# Patient Record
Sex: Female | Born: 1985 | Race: Black or African American | Hispanic: No | Marital: Single | Smoking: Never smoker
Health system: Southern US, Community
[De-identification: ages and names within clinical notes are randomized; demographics above are authoritative.]

## PROBLEM LIST (undated history)

## (undated) ENCOUNTER — Ambulatory Visit: Admission: EM | Payer: Medicaid Other | Source: Home / Self Care

## (undated) DIAGNOSIS — Z789 Other specified health status: Secondary | ICD-10-CM

## (undated) DIAGNOSIS — R569 Unspecified convulsions: Secondary | ICD-10-CM

## (undated) HISTORY — DX: Unspecified convulsions: R56.9

## (undated) HISTORY — PX: MENISCUS REPAIR: SHX5179

## (undated) HISTORY — PX: ACHILLES TENDON REPAIR: SUR1153

---

## 2004-11-08 HISTORY — PX: MENISCUS REPAIR: SHX5179

## 2013-10-28 ENCOUNTER — Emergency Department (HOSPITAL_COMMUNITY)
Admission: EM | Admit: 2013-10-28 | Discharge: 2013-10-28 | Disposition: A | Discharge status: Home / Self Care | Payer: Medicaid Other | Attending: Emergency Medicine | Admitting: Emergency Medicine

## 2013-10-28 ENCOUNTER — Encounter (HOSPITAL_COMMUNITY): Payer: Self-pay | Admitting: Emergency Medicine

## 2013-10-28 DIAGNOSIS — K0889 Other specified disorders of teeth and supporting structures: Secondary | ICD-10-CM

## 2013-10-28 DIAGNOSIS — K089 Disorder of teeth and supporting structures, unspecified: Secondary | ICD-10-CM | POA: Insufficient documentation

## 2013-10-28 DIAGNOSIS — H9209 Otalgia, unspecified ear: Secondary | ICD-10-CM | POA: Insufficient documentation

## 2013-10-28 DIAGNOSIS — O9989 Other specified diseases and conditions complicating pregnancy, childbirth and the puerperium: Secondary | ICD-10-CM | POA: Insufficient documentation

## 2013-10-28 MED ORDER — PENICILLIN V POTASSIUM 500 MG PO TABS: 500.0000 mg | ORAL_TABLET | Freq: Four times a day (QID) | ORAL | Status: AC

## 2013-10-28 MED ORDER — BUPIVACAINE-EPINEPHRINE PF 0.5-1:200000 % IJ SOLN: 1.8000 mL | Freq: Once | INTRAMUSCULAR | Status: DC

## 2013-10-28 NOTE — ED Provider Notes (Signed)
CSN: [id number]     Arrival date & time 10/28/13  1824 History  This chart was scribed for non-physician practitioner Santiago Glad, PA-C,  working with Doug Sou, MD, by Yevette Edwards, ED Scribe. This patient was seen in room WTR9/WTR9 and the patient's care was started at 7:26 PM.   First MD Initiated Contact with Patient 10/28/13 1855     Chief Complaint  Patient presents with  . Dental Pain   The history is provided by the patient. No language interpreter was used.   HPI Comments: Tracie Valenzuela is a 27 y.o. female, who is five months pregnant, who presents to the Emergency Department complaining of constant pain to a right, lower tooth which began yesterday, but worsened today. She characterizes the pain as "excruciating" and she rates the pain as 9/10. She also reports increased pain upon swallowing. The pt has also experienced pain and bleeding to her gums, headache and otalgia. She has treated the pain with Orajel and Ibuprofen without relief. She denies fever, chills, nausea, and emesis. Denies difficulty swallowing.  Denies facial swelling.  She is a non-smoker.  The pt does not have a dentist.   History reviewed. No pertinent past medical history. History reviewed. No pertinent past surgical history. History reviewed. No pertinent family history. History  Substance Use Topics  . Smoking status: Never Smoker   . Smokeless tobacco: Not on file  . Alcohol Use: Not on file   OB History   Grav Para Term Preterm Abortions TAB SAB Ect Mult Living   1              Review of Systems  Constitutional: Negative for fever and chills.  HENT: Positive for dental problem and ear pain.   Gastrointestinal: Negative for nausea and vomiting.  Neurological: Positive for headaches.  All other systems reviewed and are negative.   Allergies  Review of patient's allergies indicates no known allergies.  Home Medications  No current outpatient prescriptions on file.  Triage  Vitals: BP 128/78  Pulse 75  Temp(Src) 98.6 F (37 C) (Oral)  Resp 16  SpO2 100%  Physical Exam  Nursing note and vitals reviewed. Constitutional: She is oriented to person, place, and time. She appears well-developed and well-nourished. No distress.  HENT:  Head: Normocephalic and atraumatic.  Right Ear: Tympanic membrane and ear canal normal.  Left Ear: Tympanic membrane and ear canal normal.  Mouth/Throat: Uvula is midline, oropharynx is clear and moist and mucous membranes are normal. No trismus in the jaw. Abnormal dentition. No dental abscesses or uvula swelling. No oropharyngeal exudate, posterior oropharyngeal edema, posterior oropharyngeal erythema or tonsillar abscesses.    Pt able to open and close mouth with out difficulty. Airway intact. Uvula midline. Mild gingival swelling with tenderness over affected area, but no fluctuance. No swelling or tenderness of submental and submandibular regions.  Eyes: Conjunctivae and EOM are normal.  Neck: Normal range of motion and full passive range of motion without pain. Neck supple. No tracheal deviation present.  Cardiovascular: Normal rate, regular rhythm and normal heart sounds.   Pulmonary/Chest: Effort normal and breath sounds normal. No stridor. No respiratory distress. She has no wheezes.  Musculoskeletal: Normal range of motion.  Lymphadenopathy:       Head (right side): No submental, no submandibular, no tonsillar, no preauricular and no posterior auricular adenopathy present.       Head (left side): No submental, no submandibular, no tonsillar, no preauricular and no posterior auricular adenopathy present.  She has no cervical adenopathy.  Neurological: She is alert and oriented to person, place, and time.  Skin: Skin is warm and dry. No rash noted. She is not diaphoretic.  Psychiatric: She has a normal mood and affect. Her behavior is normal.    ED Course  Dental Date/Time: 10/29/2013 9:55 AM Performed by: Santiago Glad Authorized by: Santiago Glad Consent: Verbal consent obtained. Risks and benefits: risks, benefits and alternatives were discussed Consent given by: patient Patient understanding: patient states understanding of the procedure being performed Patient consent: the patient's understanding of the procedure matches consent given Patient identity confirmed: verbally with patient Time out: Immediately prior to procedure a "time out" was called to verify the correct patient, procedure, equipment, support staff and site/side marked as required. Local anesthesia used: yes Local anesthetic: lidocaine 2% with epinephrine Anesthetic total: 2 ml Patient sedated: no Patient tolerance: Patient tolerated the procedure well with no immediate complications.   (including critical care time)  DIAGNOSTIC STUDIES: Oxygen Saturation is 100% on room air, normal by my interpretation.    COORDINATION OF CARE:  7:32 PM- Discussed treatment plan with patient, which includes a referral to a dentist and ob-gyn as well as numbing the tooth, and the patient agreed to the plan.   8:00 PM- Rechecked pt. Pt complained of an additional site causing dental pain which she also wanted numbed.   Labs Review Labs Reviewed - No data to display Imaging Review No results found.  EKG Interpretation   None       MDM  No diagnosis found. Patient with toothache.  No gross abscess.  Exam unconcerning for Ludwig's angina or spread of infection.  Will treat with penicillin.  Patient educated on not using Ibuprofen due to the fact that she is pregnant.  Patient instructed to take Tylenol for the pain.  Urged patient to follow-up with dentist.    I personally performed the services described in this documentation, which was scribed in my presence. The recorded information has been reviewed and is accurate.    Santiago Glad, PA-C 10/29/13 [phone]

## 2013-10-28 NOTE — ED Notes (Signed)
She c/o right lower toothache and also c/o gum pain and bleeding.  She is in no distress.  She also states she had right achilles tendon repair last year and "want it checked".

## 2013-10-31 NOTE — ED Provider Notes (Signed)
Medical screening examination/treatment/procedure(s) were performed by non-physician practitioner and as supervising physician I was immediately available for consultation/collaboration.  EKG Interpretation   None        Doug Sou, MD 10/31/13 [phone]

## 2013-11-08 NOTE — L&D Delivery Note (Signed)
`````  Attestation of Attending Supervision of Advanced Practitioner: Evaluation and management procedures were performed by the PA/NP/CNM/OB Fellow under my supervision/collaboration. Chart reviewed and agree with management and plan.  Tilda BurrowJohn V Recardo Linn 02/15/2014 7:44 AM

## 2013-11-08 NOTE — L&D Delivery Note (Signed)
Delivery Note At 6:45 AM a viable female was delivered via Vaginal, Spontaneous Delivery (Presentation: OA>LOA ;  ).  APGAR:9 ,9 ; weight pending .   Placenta status: Intact, Spontaneous.  Cord: 3 vessels with the following complications: None.  Anesthesia: Epidural  Episiotomy: None Lacerations: none Suture Repair: n/a Est. Blood Loss (mL): 250  Mom to postpartum.  Baby to Nursery. [facility]  Aidah Forquer 02/11/2014, 6:58 AM

## 2013-11-09 ENCOUNTER — Encounter (HOSPITAL_COMMUNITY): Payer: Self-pay | Admitting: Emergency Medicine

## 2013-11-09 ENCOUNTER — Emergency Department (HOSPITAL_COMMUNITY)
Admission: EM | Admit: 2013-11-09 | Discharge: 2013-11-09 | Disposition: A | Discharge status: Home / Self Care | Payer: Medicaid Other | Attending: Emergency Medicine | Admitting: Emergency Medicine

## 2013-11-09 DIAGNOSIS — O98819 Other maternal infectious and parasitic diseases complicating pregnancy, unspecified trimester: Secondary | ICD-10-CM | POA: Insufficient documentation

## 2013-11-09 DIAGNOSIS — B3731 Acute candidiasis of vulva and vagina: Secondary | ICD-10-CM | POA: Insufficient documentation

## 2013-11-09 DIAGNOSIS — B373 Candidiasis of vulva and vagina: Secondary | ICD-10-CM

## 2013-11-09 DIAGNOSIS — M549 Dorsalgia, unspecified: Secondary | ICD-10-CM | POA: Insufficient documentation

## 2013-11-09 DIAGNOSIS — O9989 Other specified diseases and conditions complicating pregnancy, childbirth and the puerperium: Secondary | ICD-10-CM | POA: Insufficient documentation

## 2013-11-09 HISTORY — DX: Other specified health status: Z78.9

## 2013-11-09 LAB — URINE MICROSCOPIC-ADD ON

## 2013-11-09 LAB — URINALYSIS, ROUTINE W REFLEX MICROSCOPIC
BILIRUBIN URINE: NEGATIVE
GLUCOSE, UA: NEGATIVE mg/dL
Hgb urine dipstick: NEGATIVE
KETONES UR: NEGATIVE mg/dL
Nitrite: NEGATIVE
PH: 7.5 (ref 5.0–8.0)
Protein, ur: NEGATIVE mg/dL
Specific Gravity, Urine: 1.025 (ref 1.005–1.030)
Urobilinogen, UA: 0.2 mg/dL (ref 0.0–1.0)

## 2013-11-09 LAB — WET PREP, GENITAL
CLUE CELLS WET PREP: NONE SEEN
TRICH WET PREP: NONE SEEN

## 2013-11-09 MED ORDER — FLUCONAZOLE 150 MG PO TABS: 150.0000 mg | ORAL_TABLET | Freq: Once | ORAL | Status: DC

## 2013-11-09 MED ORDER — FLUCONAZOLE 100 MG PO TABS
150.0000 mg | ORAL_TABLET | Freq: Once | ORAL | Status: AC
  Administered 2013-11-09: 150 mg via ORAL
  Filled 2013-11-09: qty 2

## 2013-11-09 NOTE — Progress Notes (Addendum)
1035  Arrived to evaluate this 28 yo G2P1 @25 .[redacted] wks GA with complaint of "contractions".  Limited prenatal care.  [place]. [place]is [place]ren[place] @ 6wks, but did continue to attend regular care because she states she was considering a TAB.  Moved to this area recently.  Admits marijuana use today.  States it is the only thing helping her pain.  Was recently seen for tooth pain and prescribed PCN.  She stopped taking the PCN 2 days ago because she felt it was causing her vaginal itching, swelling and discharge.  1100  SSE by ED MD, swabs taken.  Cervix closed on inspection.  ED MD states he has spoken with FP and will refer patient for appointment with the clinic per their recommendation.  Awaiting urine specimen from patient.  1137 Dr. Jolayne Pantheronstant notified of FHR, UC readings and of ED plan of care.  Patient is cleared to come off the monitor/ OB cleared.

## 2013-11-09 NOTE — ED Notes (Signed)
Pt reports 6 months pregnant, began having pains x few weeks. States today began feeling like she was having contractions. No bleeding, denies water breaking. 1 previous pregnancy.

## 2013-11-09 NOTE — ED Notes (Signed)
Pt states has just moved here in past 2 months, no prenatal care here, has EDD  02/17/14--states has felt more movement from fetus in past couple days--small amount of vaginal discharge -- taking PCN for dental infection. No other vaginal discharge. States is having "contractions" started 2 days ago-- not regular at present.

## 2013-11-09 NOTE — Discharge Instructions (Signed)
If you are still having vaginal discharge in 3 days, take the extra dose of Diflucan. It is very important to follow up at [facility] in their clinic for Pre-natal care.  Candida Infection, Adult A candida infection (also called yeast, fungus and Monilia infection) is an overgrowth of yeast that can occur anywhere on the body. A yeast infection commonly occurs in warm, moist body areas. Usually, the infection remains localized but can spread to become a systemic infection. A yeast infection may be a sign of a more severe disease such as diabetes, leukemia, or AIDS. A yeast infection can occur in both men and women. In women, Candida vaginitis is a vaginal infection. It is one of the most common causes of vaginitis. Men usually do not have symptoms or know they have an infection until other problems develop. Men may find out they have a yeast infection because their sex partner has a yeast infection. Uncircumcised men are more likely to get a yeast infection than circumcised men. This is because the uncircumcised glans is not exposed to air and does not remain as dry as that of a circumcised glans. Older adults may develop yeast infections around dentures. CAUSES  Women  Antibiotics.  Steroid medication taken for a long time.  Being overweight (obese).  Diabetes.  Poor immune condition.  Certain serious medical conditions.  Immune suppressive medications for organ transplant patients.  Chemotherapy.  Pregnancy.  Menstration.  Stress and fatigue.  Intravenous drug use.  Oral contraceptives.  Wearing tight-fitting clothes in the crotch area.  Catching it from a sex partner who has a yeast infection.  Spermicide.  Intravenous, urinary, or other catheters. Men  Catching it from a sex partner who has a yeast infection.  Having oral or anal sex with a person who has the infection.  Spermicide.  Diabetes.  Antibiotics.  Poor immune system.  Medications that  suppress the immune system.  Intravenous drug use.  Intravenous, urinary, or other catheters. SYMPTOMS  Women  Thick, white vaginal discharge.  Vaginal itching.  Redness and swelling in and around the vagina.  Irritation of the lips of the vagina and perineum.  Blisters on the vaginal lips and perineum.  Painful sexual intercourse.  Low blood sugar (hypoglycemia).  Painful urination.  Bladder infections.  Intestinal problems such as constipation, indigestion, bad breath, bloating, increase in gas, diarrhea, or loose stools. Men  Men may develop intestinal problems such as constipation, indigestion, bad breath, bloating, increase in gas, diarrhea, or loose stools.  Dry, cracked skin on the penis with itching or discomfort.  Jock itch.  Dry, flaky skin.  Athlete's foot.  Hypoglycemia. DIAGNOSIS  Women  A history and an exam are performed.  The discharge may be examined under a microscope.  A culture may be taken of the discharge. Men  A history and an exam are performed.  Any discharge from the penis or areas of cracked skin will be looked at under the microscope and cultured.  Stool samples may be cultured. TREATMENT  Women  Vaginal antifungal suppositories and creams.  Medicated creams to decrease irritation and itching on the outside of the vagina.  Warm compresses to the perineal area to decrease swelling and discomfort.  Oral antifungal medications.  Medicated vaginal suppositories or cream for repeated or recurrent infections.  Wash and dry the irritation areas before applying the cream.  Eating yogurt with lactobacillus may help with prevention and treatment.  Sometimes painting the vagina with gentian violet solution may help  if creams and suppositories do not work. Men  Antifungal creams and oral antifungal medications.  Sometimes treatment must continue for 30 days after the symptoms go away to prevent recurrence. HOME CARE  INSTRUCTIONS  Women  Use cotton underwear and avoid tight-fitting clothing.  Avoid colored, scented toilet paper and deodorant tampons or pads.  Do not douche.  Keep your diabetes under control.  Finish all the prescribed medications.  Keep your skin clean and dry.  Consume milk or yogurt with lactobacillus active culture regularly. If you get frequent yeast infections and think that is what the infection is, there are over-the-counter medications that you can get. If the infection does not show healing in 3 days, talk to your caregiver.  Tell your sex partner you have a yeast infection. Your partner may need treatment also, especially if your infection does not clear up or recurs. Men  Keep your skin clean and dry.  Keep your diabetes under control.  Finish all prescribed medications.  Tell your sex partner that you have a yeast infection so they can be treated if necessary. SEEK MEDICAL CARE IF:   Your symptoms do not clear up or worsen in one week after treatment.  You have an oral temperature above 102 F (38.9 C).  You have trouble swallowing or eating for a prolonged time.  You develop blisters on and around your vagina.  You develop vaginal bleeding and it is not your menstrual period.  You develop abdominal pain.  You develop intestinal problems as mentioned above.  You get weak or lightheaded.  You have painful or increased urination.  You have pain during sexual intercourse. MAKE SURE YOU:   Understand these instructions.  Will watch your condition.  Will get help right away if you are not doing well or get worse. Document Released: 12/02/2004 Document Revised: 01/17/2012 Document Reviewed: 03/16/2010 [facility] Patient Information 2014 [employer] [place].

## 2013-11-09 NOTE — ED Provider Notes (Signed)
CSN: [id number]     Arrival date & time 11/09/13  1008 History   First MD Initiated Contact with Patient 11/09/13 1016     Chief Complaint  Patient presents with  . Contractions   (Consider location/radiation/quality/duration/timing/severity/associated sxs/prior Treatment) HPI Comments: 28 year old female who is currently approximately 25 weeks and 5 days pregnant presents with 3 days of lower abdominal cramping that feels like contractions. She states his pains worse when she had her first child. The pain seems to be constant. She's not have any dysuria or change in urination. She has been on penicillin recently for a dental infection and over the past week has been having vaginal discharge. She denied any vaginal bleeding or large leakage of fluid. She states the baby feels like it's moving around more. She states the pain is currently about a 5/10. She recently moved to this area and does not have an obstetrician currently.   History reviewed. No pertinent past medical history. History reviewed. No pertinent past surgical history. No family history on file. History  Substance Use Topics  . Smoking status: Never Smoker   . Smokeless tobacco: Not on file  . Alcohol Use: Not on file   OB History   Grav Para Term Preterm Abortions TAB SAB Ect Mult Living   1              Review of Systems  Constitutional: Negative for fever and chills.  Gastrointestinal: Positive for abdominal pain. Negative for vomiting.  Genitourinary: Positive for vaginal discharge. Negative for dysuria and vaginal bleeding.  Musculoskeletal: Positive for back pain.  All other systems reviewed and are negative.    Allergies  Review of patient's allergies indicates no known allergies.  Home Medications   Current Outpatient Rx  Name  Route  Sig  Dispense  Refill  . ibuprofen (ADVIL,MOTRIN) 200 MG tablet   Oral   Take 600 mg by mouth every 6 (six) hours as needed (pain).          BP 115/71  Pulse 80   Temp(Src) 98.4 F (36.9 C)  Resp 16  SpO2 100% Physical Exam  Nursing note and vitals reviewed. Constitutional: She is oriented to person, place, and time. She appears well-developed and well-nourished. No distress.  HENT:  Head: Normocephalic and atraumatic.  Right Ear: External ear normal.  Left Ear: External ear normal.  Nose: Nose normal.  Eyes: Right eye exhibits no discharge. Left eye exhibits no discharge.  Cardiovascular: Normal rate, regular rhythm and normal heart sounds.   Pulmonary/Chest: Effort normal and breath sounds normal.  Abdominal: Soft. There is tenderness in the suprapubic area.  Gravid  Genitourinary: Vaginal discharge found.  Cervix is closed visually during speculum exam  Neurological: She is alert and oriented to person, place, and time.  Skin: Skin is warm and dry.    ED Course  Procedures (including critical care time) Labs Review Labs Reviewed  WET PREP, GENITAL - Abnormal; Notable for the following:    Yeast Wet Prep HPF POC MANY (*)    WBC, Wet Prep HPF POC TOO NUMEROUS TO COUNT (*)    All other components within normal limits  URINALYSIS, ROUTINE W REFLEX MICROSCOPIC - Abnormal; Notable for the following:    Leukocytes, UA SMALL (*)    All other components within normal limits  URINE MICROSCOPIC-ADD ON - Abnormal; Notable for the following:    Squamous Epithelial / LPF FEW (*)    All other components within normal limits  GC/CHLAMYDIA  PROBE AMP   Imaging Review No results found.  EKG Interpretation   None       MDM   1. Vulvovaginal candidiasis    Patient's abd exam is benign. No contractions seen on continuous toco, and patient is active and has good HR pattern. No LOF, bleeding or decreased fetal movement. Her discharge is c/w yeast from recent abx. Visually her cervix is closed, and with no contractions I feel she can f/u as an outpatient. D/w Dr. Jolayne Panther at [facility], who recommends ASAP outpatient f/u to establish OB care in this  area. Recommends one dose of diflucan now, and will give an Rx in case she is still symptomatic in 3 days.     Audree Camel, MD 11/09/13 [phone]

## 2013-11-11 LAB — GC/CHLAMYDIA PROBE AMP
CT Probe RNA: NEGATIVE
GC Probe RNA: NEGATIVE

## 2013-11-14 ENCOUNTER — Encounter: Payer: Self-pay | Admitting: Obstetrics and Gynecology

## 2013-11-14 ENCOUNTER — Ambulatory Visit (INDEPENDENT_AMBULATORY_CARE_PROVIDER_SITE_OTHER): Payer: Medicaid Other | Admitting: Obstetrics and Gynecology

## 2013-11-14 ENCOUNTER — Other Ambulatory Visit (HOSPITAL_COMMUNITY)
Admission: RE | Admit: 2013-11-14 | Discharge: 2013-11-14 | Disposition: A | Discharge status: Home / Self Care | Payer: Medicaid Other | Source: Ambulatory Visit | Attending: Obstetrics and Gynecology | Admitting: Obstetrics and Gynecology

## 2013-11-14 VITALS — BP 119/73 | Temp 98.6°F | Wt 202.3 lb

## 2013-11-14 DIAGNOSIS — Z124 Encounter for screening for malignant neoplasm of cervix: Secondary | ICD-10-CM

## 2013-11-14 DIAGNOSIS — Z01419 Encounter for gynecological examination (general) (routine) without abnormal findings: Secondary | ICD-10-CM | POA: Insufficient documentation

## 2013-11-14 DIAGNOSIS — O0932 Supervision of pregnancy with insufficient antenatal care, second trimester: Secondary | ICD-10-CM

## 2013-11-14 DIAGNOSIS — Z23 Encounter for immunization: Secondary | ICD-10-CM

## 2013-11-14 DIAGNOSIS — O0930 Supervision of pregnancy with insufficient antenatal care, unspecified trimester: Secondary | ICD-10-CM

## 2013-11-14 LAB — POCT URINALYSIS DIP (DEVICE)
Bilirubin Urine: NEGATIVE
Glucose, UA: NEGATIVE mg/dL
HGB URINE DIPSTICK: NEGATIVE
Ketones, ur: NEGATIVE mg/dL
Leukocytes, UA: NEGATIVE
Nitrite: NEGATIVE
PH: 7 (ref 5.0–8.0)
PROTEIN: 30 mg/dL — AB
SPECIFIC GRAVITY, URINE: 1.025 (ref 1.005–1.030)
Urobilinogen, UA: 1 mg/dL (ref 0.0–1.0)

## 2013-11-14 LAB — HIV ANTIBODY (ROUTINE TESTING W REFLEX): HIV: NONREACTIVE

## 2013-11-14 LAB — POCT PREGNANCY, URINE: PREG TEST UR: POSITIVE — AB

## 2013-11-14 LAB — GLUCOSE TOLERANCE, 1 HOUR (50G) W/O FASTING: Glucose, 1 Hour GTT: 80 mg/dL (ref 70–140)

## 2013-11-14 MED ORDER — PRENATAL PLUS 27-1 MG PO TABS: 1.0000 | ORAL_TABLET | Freq: Every day | ORAL | Status: DC

## 2013-11-14 MED ORDER — TETANUS-DIPHTH-ACELL PERTUSSIS 5-2.5-18.5 LF-MCG/0.5 IM SUSP: 0.5000 mL | Freq: Once | INTRAMUSCULAR | Status: DC

## 2013-11-14 NOTE — Progress Notes (Signed)
   Subjective:    Tracie Valenzuela is a 28 yo G2P1001 2770w3d being seen today for her first obstetrical visit.  Her obstetrical history is significant for NSVD. Patient does intend to breast feed. Pregnancy history fully reviewed.  Patient reports no complaints. [place] [employer] [place] [employer] [place] [employer] [place]  Filed Vitals:   11/14/13 0847  BP: 119/73  Temp: 98.6 F (37 C)  Weight: 202 lb 4.8 oz (91.763 kg)    HISTORY: OB History  Gravida Para Term Preterm AB SAB TAB Ectopic Multiple Living  2 1 1  0 0 0 0 0 0 1    # Outcome Date GA Lbr Len/2nd Weight Sex Delivery Anes PTL Lv  2 CUR           1 TRM 02/24/06 7103w0d  7 lb 6 oz (3.345 kg) F SVD EPI N Y     Past Medical History  Diagnosis Date  . Medical history non-contributory    Past Surgical History  Procedure Laterality Date  . Meniscus repair    . Achilles tendon repair     Family History  Problem Relation Age of Onset  . Hypertension Paternal Grandmother   . Diabetes Paternal Grandmother      Exam    Uterus:  Fundal Height: 22 cm  Pelvic Exam:    Perineum: Normal Perineum   Vulva: normal, Bartholin's, Urethra, Skene's normal   Vagina:  normal mucosa, normal discharge       Cervix: multiparous appearance   Adnexa: not evaluated   Bony Pelvis: gynecoid  System: Breast:  normal appearance, no masses or tenderness   Skin: normal coloration and turgor, no rashes    Neurologic: oriented, normal, grossly non-focal   Extremities: normal strength, tone, and muscle mass   HEENT PERRLA and extra ocular movement intact   Mouth/Teeth mucous membranes moist, pharynx normal without lesions and dental hygiene good   Neck supple and no masses   Cardiovascular: regular rate and rhythm, no murmurs or gallops   Respiratory:  appears well, vitals normal, no respiratory distress, acyanotic, normal RR, ear and throat exam is normal, neck free of mass or lymphadenopathy, chest clear, no wheezing, crepitations, rhonchi,  normal symmetric air entry   Abdomen: S=D   Urinary: urethral meatus normal      Assessment:    Pregnancy: G2P1001 Patient Active Problem List   Diagnosis Date Noted  . Insufficient prenatal care in second trimester 11/14/2013        Plan:     Initial labs drawn. Glucola.  Prenatal vitamins. Problem list reviewed and updated. Genetic Screening discussed Quad Screen: too late.  Ultrasound discussed; fetal survey: ordered.  Follow up in 4 weeks. 50% of 30min visit spent on counseling and coordination of care.  Reviewed pregnnany precautions, visit schedule  Paeton Latouche 11/14/2013

## 2013-11-14 NOTE — Progress Notes (Signed)
Pulse- 87 Patient reports pelvic pressure and "irritability", like the other night when she went to the ER

## 2013-11-14 NOTE — Patient Instructions (Signed)
Second Trimester of Pregnancy The second trimester is from week 13 through week 28, months 4 through 6. The second trimester is often a time when you feel your best. Your body has also adjusted to being pregnant, and you begin to feel better physically. Usually, morning sickness has lessened or quit completely, you may have more energy, and you may have an increase in appetite. The second trimester is also a time when the fetus is growing rapidly. At the end of the sixth month, the fetus is about 9 inches long and weighs about 1 pounds. You will likely begin to feel the baby move (quickening) between 18 and 20 weeks of the pregnancy. BODY CHANGES Your body goes through many changes during pregnancy. The changes vary from woman to woman.   Your weight will continue to increase. You will notice your lower abdomen bulging out.  You may begin to get stretch marks on your hips, abdomen, and breasts.  You may develop headaches that can be relieved by medicines approved by your caregiver.  You may urinate more often because the fetus is pressing on your bladder.  You may develop or continue to have heartburn as a result of your pregnancy.  You may develop constipation because certain hormones are causing the muscles that push waste through your intestines to slow down.  You may develop hemorrhoids or swollen, bulging veins (varicose veins).  You may have back pain because of the weight gain and pregnancy hormones relaxing your joints between the bones in your pelvis and as a result of a shift in weight and the muscles that support your balance.  Your breasts will continue to grow and be tender.  Your gums may bleed and may be sensitive to brushing and flossing.  Dark spots or blotches (chloasma, mask of pregnancy) may develop on your face. This will likely fade after the baby is born.  A dark line from your belly button to the pubic area (linea nigra) may appear. This will likely fade after the  baby is born. WHAT TO EXPECT AT YOUR PRENATAL VISITS During a routine prenatal visit:  You will be weighed to make sure you and the fetus are growing normally.  Your blood pressure will be taken.  Your abdomen will be measured to track your baby's growth.  The fetal heartbeat will be listened to.  Any test results from the previous visit will be discussed. Your caregiver may ask you:  How you are feeling.  If you are feeling the baby move.  If you have had any abnormal symptoms, such as leaking fluid, bleeding, severe headaches, or abdominal cramping.  If you have any questions. Other tests that may be performed during your second trimester include:  Blood tests that check for:  Low iron levels (anemia).  Gestational diabetes (between 24 and 28 weeks).  Rh antibodies.  Urine tests to check for infections, diabetes, or protein in the urine.  An ultrasound to confirm the proper growth and development of the baby.  An amniocentesis to check for possible genetic problems.  Fetal screens for spina bifida and Down syndrome. HOME CARE INSTRUCTIONS   Avoid all smoking, herbs, alcohol, and unprescribed drugs. These chemicals affect the formation and growth of the baby.  Follow your caregiver's instructions regarding medicine use. There are medicines that are either safe or unsafe to take during pregnancy.  Exercise only as directed by your caregiver. Experiencing uterine cramps is a good sign to stop exercising.  Continue to eat regular,   healthy meals.  Wear a good support bra for breast tenderness.  Do not use hot tubs, steam rooms, or saunas.  Wear your seat belt at all times when driving.  Avoid raw meat, uncooked cheese, cat litter boxes, and soil used by cats. These carry germs that can cause birth defects in the baby.  Take your prenatal vitamins.  Try taking a stool softener (if your caregiver approves) if you develop constipation. Eat more high-fiber foods,  such as fresh vegetables or fruit and whole grains. Drink plenty of fluids to keep your urine clear or pale yellow.  Take warm sitz baths to soothe any pain or discomfort caused by hemorrhoids. Use hemorrhoid cream if your caregiver approves.  If you develop varicose veins, wear support hose. Elevate your feet for 15 minutes, 3 4 times a day. Limit salt in your diet.  Avoid heavy lifting, wear low heel shoes, and practice good posture.  Rest with your legs elevated if you have leg cramps or low back pain.  Visit your dentist if you have not gone yet during your pregnancy. Use a soft toothbrush to brush your teeth and be gentle when you floss.  A sexual relationship may be continued unless your caregiver directs you otherwise.  Continue to go to all your prenatal visits as directed by your caregiver. SEEK MEDICAL CARE IF:   You have dizziness.  You have mild pelvic cramps, pelvic pressure, or nagging pain in the abdominal area.  You have persistent nausea, vomiting, or diarrhea.  You have a bad smelling vaginal discharge.  You have pain with urination. SEEK IMMEDIATE MEDICAL CARE IF:   You have a fever.  You are leaking fluid from your vagina.  You have spotting or bleeding from your vagina.  You have severe abdominal cramping or pain.  You have rapid weight gain or loss.  You have shortness of breath with chest pain.  You notice sudden or extreme swelling of your face, hands, ankles, feet, or legs.  You have not felt your baby move in over an hour.  You have severe headaches that do not go away with medicine.  You have vision changes. Document Released: 10/19/2001 Document Revised: 06/27/2013 Document Reviewed: 12/26/2012 [employer] Patient Information 2014 [employer].  

## 2013-11-15 LAB — OBSTETRIC PANEL
ANTIBODY SCREEN: NEGATIVE
BASOS ABS: 0 10*3/uL (ref 0.0–0.1)
Basophils Relative: 0 % (ref 0–1)
EOS PCT: 0 % (ref 0–5)
Eosinophils Absolute: 0 10*3/uL (ref 0.0–0.7)
HCT: 32 % — ABNORMAL LOW (ref 36.0–46.0)
Hemoglobin: 10.8 g/dL — ABNORMAL LOW (ref 12.0–15.0)
Hepatitis B Surface Ag: NEGATIVE
Lymphocytes Relative: 18 % (ref 12–46)
Lymphs Abs: 1.8 10*3/uL (ref 0.7–4.0)
MCH: 28.3 pg (ref 26.0–34.0)
MCHC: 33.8 g/dL (ref 30.0–36.0)
MCV: 84 fL (ref 78.0–100.0)
MONO ABS: 0.8 10*3/uL (ref 0.1–1.0)
Monocytes Relative: 8 % (ref 3–12)
Neutro Abs: 7.4 10*3/uL (ref 1.7–7.7)
Neutrophils Relative %: 74 % (ref 43–77)
Platelets: 297 10*3/uL (ref 150–400)
RBC: 3.81 MIL/uL — ABNORMAL LOW (ref 3.87–5.11)
RDW: 13.9 % (ref 11.5–15.5)
RUBELLA: 0.87 {index} (ref <0.90)
Rh Type: NEGATIVE
WBC: 10 10*3/uL (ref 4.0–10.5)

## 2013-11-16 LAB — HEMOGLOBINOPATHY EVALUATION
HGB A: 97.5 % (ref 96.8–97.8)
Hemoglobin Other: 0 %
Hgb A2 Quant: 2.5 % (ref 2.2–3.2)
Hgb F Quant: 0 % (ref 0.0–2.0)
Hgb S Quant: 0 %

## 2013-11-16 LAB — PRESCRIPTION MONITORING PROFILE (19 PANEL)
AMPHETAMINE/METH: NEGATIVE ng/mL
Barbiturate Screen, Urine: NEGATIVE ng/mL
Benzodiazepine Screen, Urine: NEGATIVE ng/mL
Buprenorphine, Urine: NEGATIVE ng/mL
CARISOPRODOL, URINE: NEGATIVE ng/mL
CREATININE, URINE: 372.08 mg/dL (ref >20.0)
Cocaine Metabolites: NEGATIVE ng/mL
Fentanyl, Ur: NEGATIVE ng/mL
MDMA URINE: NEGATIVE ng/mL
METHADONE SCREEN, URINE: NEGATIVE ng/mL
Meperidine, Ur: NEGATIVE ng/mL
Methaqualone: NEGATIVE ng/mL
NITRITES URINE, INITIAL: NEGATIVE ug/mL
OXYCODONE SCRN UR: NEGATIVE ng/mL
Opiate Screen, Urine: NEGATIVE ng/mL
PH URINE, INITIAL: 6.7 pH (ref 4.5–8.9)
PROPOXYPHENE: NEGATIVE ng/mL
Phencyclidine, Ur: NEGATIVE ng/mL
TAPENTADOLUR: NEGATIVE ng/mL
TRAMADOL UR: NEGATIVE ng/mL
Zolpidem, Urine: NEGATIVE ng/mL

## 2013-11-16 LAB — CULTURE, OB URINE: Colony Count: 2000

## 2013-11-16 LAB — CANNABANOIDS (GC/LC/MS), URINE

## 2013-11-19 ENCOUNTER — Ambulatory Visit (HOSPITAL_COMMUNITY)
Admission: RE | Admit: 2013-11-19 | Discharge: 2013-11-19 | Disposition: A | Discharge status: Home / Self Care | Payer: Medicaid Other | Source: Ambulatory Visit | Attending: Obstetrics and Gynecology | Admitting: Obstetrics and Gynecology

## 2013-11-19 ENCOUNTER — Other Ambulatory Visit: Payer: Self-pay | Admitting: Obstetrics and Gynecology

## 2013-11-19 ENCOUNTER — Ambulatory Visit (HOSPITAL_COMMUNITY): Payer: Medicaid Other

## 2013-11-19 DIAGNOSIS — O0932 Supervision of pregnancy with insufficient antenatal care, second trimester: Secondary | ICD-10-CM

## 2013-11-19 DIAGNOSIS — Z23 Encounter for immunization: Secondary | ICD-10-CM

## 2013-11-19 DIAGNOSIS — O26879 Cervical shortening, unspecified trimester: Secondary | ICD-10-CM

## 2013-11-19 DIAGNOSIS — Z3689 Encounter for other specified antenatal screening: Secondary | ICD-10-CM | POA: Insufficient documentation

## 2013-11-19 DIAGNOSIS — O0930 Supervision of pregnancy with insufficient antenatal care, unspecified trimester: Secondary | ICD-10-CM | POA: Insufficient documentation

## 2013-12-03 DIAGNOSIS — O26879 Cervical shortening, unspecified trimester: Secondary | ICD-10-CM | POA: Insufficient documentation

## 2013-12-04 ENCOUNTER — Telehealth: Payer: Self-pay | Admitting: *Deleted

## 2013-12-04 NOTE — Telephone Encounter (Addendum)
Message copied by Jill SideAY, Vernecia Umble L on Tue Dec 04, 2013 10:29 AM ------      Message from: POE, DEIRDRE C      Created: Mon Dec 03, 2013  7:36 PM       Cx was slightly short on US> can she be scheduled for a F/U this wk instead of next? If not we can call her to make sure no PTL sx. Thax   ------  Called pt and informed her of shortened cervix on US done 1/12. She stated that she had been informed of those results. She reports having frequent back pain which sometimes makes it difficult for her to get out of bed. She agreed to clinic appt tomorrow @ [phone].

## 2013-12-05 ENCOUNTER — Ambulatory Visit (INDEPENDENT_AMBULATORY_CARE_PROVIDER_SITE_OTHER): Payer: Medicaid Other | Admitting: Family Medicine

## 2013-12-05 VITALS — BP 128/75 | Temp 97.4°F | Wt 202.5 lb

## 2013-12-05 DIAGNOSIS — O99320 Drug use complicating pregnancy, unspecified trimester: Secondary | ICD-10-CM

## 2013-12-05 DIAGNOSIS — O26899 Other specified pregnancy related conditions, unspecified trimester: Secondary | ICD-10-CM

## 2013-12-05 DIAGNOSIS — O0932 Supervision of pregnancy with insufficient antenatal care, second trimester: Secondary | ICD-10-CM

## 2013-12-05 DIAGNOSIS — F1290 Cannabis use, unspecified, uncomplicated: Secondary | ICD-10-CM

## 2013-12-05 DIAGNOSIS — O360990 Maternal care for other rhesus isoimmunization, unspecified trimester, not applicable or unspecified: Secondary | ICD-10-CM

## 2013-12-05 DIAGNOSIS — F1210 Cannabis abuse, uncomplicated: Secondary | ICD-10-CM

## 2013-12-05 DIAGNOSIS — Z6791 Unspecified blood type, Rh negative: Principal | ICD-10-CM | POA: Insufficient documentation

## 2013-12-05 DIAGNOSIS — O0930 Supervision of pregnancy with insufficient antenatal care, unspecified trimester: Secondary | ICD-10-CM

## 2013-12-05 DIAGNOSIS — F1920 Other psychoactive substance dependence, uncomplicated: Secondary | ICD-10-CM

## 2013-12-05 DIAGNOSIS — O26879 Cervical shortening, unspecified trimester: Secondary | ICD-10-CM

## 2013-12-05 LAB — POCT URINALYSIS DIP (DEVICE)
Glucose, UA: NEGATIVE mg/dL
Ketones, ur: NEGATIVE mg/dL
Nitrite: NEGATIVE
PROTEIN: 30 mg/dL — AB
Specific Gravity, Urine: 1.025 (ref 1.005–1.030)
Urobilinogen, UA: 0.2 mg/dL (ref 0.0–1.0)
pH: 7 (ref 5.0–8.0)

## 2013-12-05 MED ORDER — ONDANSETRON 4 MG PO TBDP: 4.0000 mg | ORAL_TABLET | Freq: Three times a day (TID) | ORAL | Status: DC | PRN

## 2013-12-05 MED ORDER — RHO D IMMUNE GLOBULIN 1500 UNIT/2ML IJ SOLN: 300.0000 ug | Freq: Once | INTRAMUSCULAR | Status: DC

## 2013-12-05 NOTE — Progress Notes (Signed)
Pt. Unable to get Rhogam today as it is not in stock and pharmacy does not have any-- due for an order to be here by Friday. Will have pt. Schedule an appointment for Monday (to ensure dose is here) but explained that I will call her with an injection appointment if it comes in earlier. Pt. Verbalized understanding and stated she will keep an open schedule.

## 2013-12-05 NOTE — Progress Notes (Signed)
Complains of cramping in inguinal region and inner thighs and back pains only occurs when sitting down. Improves with MJ use.  Nausea in morning with decreased appetite. Uses ensure and improves.  Will give zofran Reviewed exercieses  Tracie Valenzuela is a 28 y.o. G2P1001 at 3220w3d here for ROB visit.  Discussed with Patient:  -Plans to breast feed.  All questions answered. -Continue prenatal vitamins. -Reviewed fetal kick counts (Pt to perform daily at a time when the baby is active, lie laterally with both hands on belly in quiet room and count all movements (hiccups, shoulder rolls, obvious kicks, etc); pt is to report to clinic or MAU for less than 10 movements felt in a one hour time period-pt told as soon as she counts 10 movements the count is complete.)  - Routine precautions discussed (depression, infection s/s).   Patient provided with all pertinent phone numbers for emergencies. - RTC for any VB, regular, painful cramps/ctxs occurring at a rate of >2/10 min, fever (100.5 or higher), n/v/d, any pain that is unresolving or worsening, LOF, decreased fetal movement, CP, SOB, edema  Problems: Patient Active Problem List   Diagnosis Date Noted  . Cervical shortening complicating pregnancy 12/03/2013  . Insufficient prenatal care in second trimester 11/14/2013    To Do: 1. Rhogam given (if Rh-)  [ ]  Vaccines: Rec'd [ ]  BCM: undecided  Edu: [x ] PTL precautions; [ ]  BF class; [ ]  childbirth class; [ ]   BF counseling;

## 2013-12-05 NOTE — Addendum Note (Signed)
Addended by: Minta BalsamDOM, Jameila Keeny R on: 12/05/2013 10:02 AM   Modules accepted: Orders

## 2013-12-05 NOTE — Patient Instructions (Addendum)
Third Trimester of Pregnancy The third trimester is from week 29 through week 42, months 7 through 9. The third trimester is a time when the fetus is growing rapidly. At the end of the ninth month, the fetus is about 20 inches in length and weighs 6 10 pounds.  BODY CHANGES Your body goes through many changes during pregnancy. The changes vary from woman to woman.   Your weight will continue to increase. You can expect to gain 25 35 pounds (11 16 kg) by the end of the pregnancy.  You may begin to get stretch marks on your hips, abdomen, and breasts.  You may urinate more often because the fetus is moving lower into your pelvis and pressing on your bladder.  You may develop or continue to have heartburn as a result of your pregnancy.  You may develop constipation because certain hormones are causing the muscles that push waste through your intestines to slow down.  You may develop hemorrhoids or swollen, bulging veins (varicose veins).  You may have pelvic pain because of the weight gain and pregnancy hormones relaxing your joints between the bones in your pelvis. Back aches may result from over exertion of the muscles supporting your posture.  Your breasts will continue to grow and be tender. A yellow discharge may leak from your breasts called colostrum.  Your belly button may stick out.  You may feel short of breath because of your expanding uterus.  You may notice the fetus "dropping," or moving lower in your abdomen.  You may have a bloody mucus discharge. This usually occurs a few days to a week before labor begins.  Your cervix becomes thin and soft (effaced) near your due date. WHAT TO EXPECT AT YOUR PRENATAL EXAMS  You will have prenatal exams every 2 weeks until week 36. Then, you will have weekly prenatal exams. During a routine prenatal visit:  You will be weighed to make sure you and the fetus are growing normally.  Your blood pressure is taken.  Your abdomen will be  measured to track your baby's growth.  The fetal heartbeat will be listened to.  Any test results from the previous visit will be discussed.  You may have a cervical check near your due date to see if you have effaced. At around 36 weeks, your caregiver will check your cervix. At the same time, your caregiver will also perform a test on the secretions of the vaginal tissue. This test is to determine if a type of bacteria, Group B streptococcus, is present. Your caregiver will explain this further. Your caregiver may ask you:  What your birth plan is.  How you are feeling.  If you are feeling the baby move.  If you have had any abnormal symptoms, such as leaking fluid, bleeding, severe headaches, or abdominal cramping.  If you have any questions. Other tests or screenings that may be performed during your third trimester include:  Blood tests that check for low iron levels (anemia).  Fetal testing to check the health, activity level, and growth of the fetus. Testing is done if you have certain medical conditions or if there are problems during the pregnancy. FALSE LABOR You may feel small, irregular contractions that eventually go away. These are called Braxton Hicks contractions, or false labor. Contractions may last for hours, days, or even weeks before true labor sets in. If contractions come at regular intervals, intensify, or become painful, it is best to be seen by your caregiver.  SIGNS OF LABOR   Menstrual-like cramps.  Contractions that are 5 minutes apart or less.  Contractions that start on the top of the uterus and spread down to the lower abdomen and back.  A sense of increased pelvic pressure or back pain.  A watery or bloody mucus discharge that comes from the vagina. If you have any of these signs before the 37th week of pregnancy, call your caregiver right away. You need to go to the hospital to get checked immediately. HOME CARE INSTRUCTIONS   Avoid all  smoking, herbs, alcohol, and unprescribed drugs. These chemicals affect the formation and growth of the baby.  Follow your caregiver's instructions regarding medicine use. There are medicines that are either safe or unsafe to take during pregnancy.  Exercise only as directed by your caregiver. Experiencing uterine cramps is a good sign to stop exercising.  Continue to eat regular, healthy meals.  Wear a good support bra for breast tenderness.  Do not use hot tubs, steam rooms, or saunas.  Wear your seat belt at all times when driving.  Avoid raw meat, uncooked cheese, cat litter boxes, and soil used by cats. These carry germs that can cause birth defects in the baby.  Take your prenatal vitamins.  Try taking a stool softener (if your caregiver approves) if you develop constipation. Eat more high-fiber foods, such as fresh vegetables or fruit and whole grains. Drink plenty of fluids to keep your urine clear or pale yellow.  Take warm sitz baths to soothe any pain or discomfort caused by hemorrhoids. Use hemorrhoid cream if your caregiver approves.  If you develop varicose veins, wear support hose. Elevate your feet for 15 minutes, 3 4 times a day. Limit salt in your diet.  Avoid heavy lifting, wear low heal shoes, and practice good posture.  Rest a lot with your legs elevated if you have leg cramps or low back pain.  Visit your dentist if you have not gone during your pregnancy. Use a soft toothbrush to brush your teeth and be gentle when you floss.  A sexual relationship may be continued unless your caregiver directs you otherwise.  Do not travel far distances unless it is absolutely necessary and only with the approval of your caregiver.  Take prenatal classes to understand, practice, and ask questions about the labor and delivery.  Make a trial run to the hospital.  Pack your hospital bag.  Prepare the baby's nursery.  Continue to go to all your prenatal visits as directed  by your caregiver. SEEK MEDICAL CARE IF:  You are unsure if you are in labor or if your water has broken.  You have dizziness.  You have mild pelvic cramps, pelvic pressure, or nagging pain in your abdominal area.  You have persistent nausea, vomiting, or diarrhea.  You have a bad smelling vaginal discharge.  You have pain with urination. SEEK IMMEDIATE MEDICAL CARE IF:   You have a fever.  You are leaking fluid from your vagina.  You have spotting or bleeding from your vagina.  You have severe abdominal cramping or pain.  You have rapid weight loss or gain.  You have shortness of breath with chest pain.  You notice sudden or extreme swelling of your face, hands, ankles, feet, or legs.  You have not felt your baby move in over an hour.  You have severe headaches that do not go away with medicine.  You have vision changes. Document Released: 10/19/2001 Document Revised: 06/27/2013 Document Reviewed:   12/26/2012 [employer] Patient Information 2014 [place]  Back Exercises These exercises may help you when beginning to rehabilitate your injury. Your symptoms may resolve with or without further involvement from your physician, physical therapist or athletic trainer. While completing these exercises, remember:   Restoring tissue flexibility helps normal motion to return to the joints. This allows healthier, less painful movement and activity.  An effective stretch should be held for at least 30 seconds.  A stretch should never be painful. You should only feel a gentle lengthening or release in the stretched tissue. STRETCH  Extension, Prone on Elbows   Lie on your stomach on the floor, a bed will be too soft. Place your palms about shoulder width apart and at the height of your head.  Place your elbows under your shoulders. If this is too painful, stack pillows under your chest.  Allow your body to relax so that your hips drop lower and make contact more completely  with the floor.  Hold this position for __________ seconds.  Slowly return to lying flat on the floor. Repeat __________ times. Complete this exercise __________ times per day.  RANGE OF MOTION  Extension, Prone Press Ups   Lie on your stomach on the floor, a bed will be too soft. Place your palms about shoulder width apart and at the height of your head.  Keeping your back as relaxed as possible, slowly straighten your elbows while keeping your hips on the floor. You may adjust the placement of your hands to maximize your comfort. As you gain motion, your hands will come more underneath your shoulders.  Hold this position __________ seconds.  Slowly return to lying flat on the floor. Repeat __________ times. Complete this exercise __________ times per day.  RANGE OF MOTION- Quadruped, Neutral Spine   Assume a hands and knees position on a firm surface. Keep your hands under your shoulders and your knees under your hips. You may place padding under your knees for comfort.  Drop your head and point your tail bone toward the ground below you. This will round out your low back like an angry cat. Hold this position for __________ seconds.  Slowly lift your head and release your tail bone so that your back sags into a large arch, like an old horse.  Hold this position for __________ seconds.  Repeat this until you feel limber in your low back.  Now, find your "sweet spot." This will be the most comfortable position somewhere between the two previous positions. This is your neutral spine. Once you have found this position, tense your stomach muscles to support your low back.  Hold this position for __________ seconds. Repeat __________ times. Complete this exercise __________ times per day.  STRETCH  Flexion, Single Knee to Chest   Lie on a firm bed or floor with both legs extended in front of you.  Keeping one leg in contact with the floor, bring your opposite knee to your chest. Hold  your leg in place by either grabbing behind your thigh or at your knee.  Pull until you feel a gentle stretch in your low back. Hold __________ seconds.  Slowly release your grasp and repeat the exercise with the opposite side. Repeat __________ times. Complete this exercise __________ times per day.  STRETCH - Hamstrings, Standing  Stand or sit and extend your right / left leg, placing your foot on a chair or foot stool  Keeping a slight arch in your low back and your  hips straight forward.  Lead with your chest and lean forward at the waist until you feel a gentle stretch in the back of your right / left knee or thigh. (When done correctly, this exercise requires leaning only a small distance.)  Hold this position for __________ seconds. Repeat __________ times. Complete this stretch __________ times per day. STRENGTHENING  Deep Abdominals, Pelvic Tilt   Lie on a firm bed or floor. Keeping your legs in front of you, bend your knees so they are both pointed toward the ceiling and your feet are flat on the floor.  Tense your lower abdominal muscles to press your low back into the floor. This motion will rotate your pelvis so that your tail bone is scooping upwards rather than pointing at your feet or into the floor.  With a gentle tension and even breathing, hold this position for __________ seconds. Repeat __________ times. Complete this exercise __________ times per day.  STRENGTHENING  Abdominals, Crunches   Lie on a firm bed or floor. Keeping your legs in front of you, bend your knees so they are both pointed toward the ceiling and your feet are flat on the floor. Cross your arms over your chest.  Slightly tip your chin down without bending your neck.  Tense your abdominals and slowly lift your trunk high enough to just clear your shoulder blades. Lifting higher can put excessive stress on the low back and does not further strengthen your abdominal muscles.  Control your return to  the starting position. Repeat __________ times. Complete this exercise __________ times per day.  STRENGTHENING  Quadruped, Opposite UE/LE Lift   Assume a hands and knees position on a firm surface. Keep your hands under your shoulders and your knees under your hips. You may place padding under your knees for comfort.  Find your neutral spine and gently tense your abdominal muscles so that you can maintain this position. Your shoulders and hips should form a rectangle that is parallel with the floor and is not twisted.  Keeping your trunk steady, lift your right hand no higher than your shoulder and then your left leg no higher than your hip. Make sure you are not holding your breath. Hold this position __________ seconds.  Continuing to keep your abdominal muscles tense and your back steady, slowly return to your starting position. Repeat with the opposite arm and leg. Repeat __________ times. Complete this exercise __________ times per day. Document Released: 11/12/2005 Document Revised: 01/17/2012 Document Reviewed: 02/06/2009 [facility] Patient Information 2014 [place], [place].

## 2013-12-05 NOTE — Progress Notes (Signed)
Pulse- 85 Patient reports severe cramps in pelvis and lower back and inner thighs, states it makes it difficult to move sometimes because they are so strong

## 2013-12-10 ENCOUNTER — Ambulatory Visit: Payer: Medicaid Other

## 2013-12-12 ENCOUNTER — Encounter: Payer: Medicaid Other | Admitting: Advanced Practice Midwife

## 2013-12-19 ENCOUNTER — Encounter: Payer: Self-pay | Admitting: Advanced Practice Midwife

## 2013-12-19 ENCOUNTER — Ambulatory Visit (INDEPENDENT_AMBULATORY_CARE_PROVIDER_SITE_OTHER): Payer: Medicaid Other | Admitting: Advanced Practice Midwife

## 2013-12-19 VITALS — BP 131/81 | Temp 97.6°F | Wt 198.4 lb

## 2013-12-19 DIAGNOSIS — O99320 Drug use complicating pregnancy, unspecified trimester: Secondary | ICD-10-CM

## 2013-12-19 DIAGNOSIS — F1210 Cannabis abuse, uncomplicated: Secondary | ICD-10-CM

## 2013-12-19 DIAGNOSIS — F1290 Cannabis use, unspecified, uncomplicated: Secondary | ICD-10-CM

## 2013-12-19 DIAGNOSIS — O360990 Maternal care for other rhesus isoimmunization, unspecified trimester, not applicable or unspecified: Secondary | ICD-10-CM

## 2013-12-19 DIAGNOSIS — O26879 Cervical shortening, unspecified trimester: Secondary | ICD-10-CM

## 2013-12-19 DIAGNOSIS — Z6791 Unspecified blood type, Rh negative: Secondary | ICD-10-CM

## 2013-12-19 DIAGNOSIS — N390 Urinary tract infection, site not specified: Secondary | ICD-10-CM

## 2013-12-19 DIAGNOSIS — O26899 Other specified pregnancy related conditions, unspecified trimester: Secondary | ICD-10-CM

## 2013-12-19 DIAGNOSIS — F1920 Other psychoactive substance dependence, uncomplicated: Secondary | ICD-10-CM

## 2013-12-19 DIAGNOSIS — O26893 Other specified pregnancy related conditions, third trimester: Secondary | ICD-10-CM

## 2013-12-19 LAB — POCT URINALYSIS DIP (DEVICE)
Bilirubin Urine: NEGATIVE
GLUCOSE, UA: NEGATIVE mg/dL
HGB URINE DIPSTICK: NEGATIVE
Ketones, ur: NEGATIVE mg/dL
Nitrite: NEGATIVE
PROTEIN: 30 mg/dL — AB
Specific Gravity, Urine: 1.02 (ref 1.005–1.030)
UROBILINOGEN UA: 1 mg/dL (ref 0.0–1.0)
pH: 7.5 (ref 5.0–8.0)

## 2013-12-19 MED ORDER — RHO D IMMUNE GLOBULIN 1500 UNIT/2ML IJ SOLN
300.0000 ug | Freq: Once | INTRAMUSCULAR | Status: AC
  Administered 2013-12-19: 300 ug via INTRAMUSCULAR

## 2013-12-19 NOTE — Progress Notes (Signed)
Discussed pelvic pressure. UCs occur only once per hour or less. PTL precautions reviewed. Will recheck Cervical length next week. Still smoking MJ for "nausea", despite having Rx for Zofran. Dangers of MJ use on developing fetus reviewed. Advised to cease use. May add Vit B6 and zofran. UDS today

## 2013-12-19 NOTE — Patient Instructions (Signed)
Marijuana Abuse  Your exam shows you have used marijuana or pot. There are many health problems related to marijuana abuse. These include:   Bronchitis.   Chronic cough.   Emphysema.   Lung and upper airway cancer.  Abusers also experience impairment in:   Memory.   Judgment.   Ability to learn.   Coordination.  Students who smoke marijuana:   Get lower grades.   Are less likely to graduate than those who do not.  Adults who abuse marijuana:   Have problems at work.   May even lose their jobs due to:   Poor work performance.   Absenteeism.  Attention, memory, and learning skills have been shown to be diminished for up to 6 months after stopping regular use, and there is evidence that the effects can be cumulative over a lifetime.   Heavier use of marijuana also puts a strain on relationships with friends and loved ones and can lead to moodiness and loss of confidence. Acute intoxication can lead to:   Increased anxiety.   A panic episode.  It also increases the risk for having an automobile accident. This is especially true if the pot is combined with alcohol or other intoxicants. Treatment for acute intoxication is rarely needed. However, medicine to reduce anxiety may be helpful in some people.  Millions of people are considered to be dependent on marijuana. It is long-term regular use that leads to addiction and all of its complex problems. Information on the problem of addiction and the health problems of long-term abuse is posted at the National Institute for Drug Abuse website, www.drugabuse.gov. Consult with your doctor or counselor if you want further information and support in handling this common problem.  Document Released: 12/02/2004 Document Revised: 01/17/2012 Document Reviewed: 09/19/2007  [employer] Patient Information 2014 [employer].

## 2013-12-19 NOTE — Progress Notes (Signed)
Pulse- Patient needs refill on PNV; needs to get Rhogam today.  Patient reports pelvic pressure & tenderness at pubic bone. Also reports occasional contractions causing sharp pains;

## 2013-12-19 NOTE — Addendum Note (Signed)
Addended by: Franchot MimesALFARO, Catia Todorov on: 12/19/2013 02:33 PM   Modules accepted: Orders

## 2013-12-19 NOTE — Addendum Note (Signed)
Addended by: Gerome ApleyZEYFANG, LINDA L on: 12/19/2013 11:42 AM   Modules accepted: Orders

## 2013-12-20 LAB — ANTIBODY SCREEN: Antibody Screen: NEGATIVE

## 2013-12-21 ENCOUNTER — Encounter: Payer: Self-pay | Admitting: Advanced Practice Midwife

## 2013-12-21 LAB — PRESCRIPTION MONITORING PROFILE (19 PANEL)
Amphetamine/Meth: NEGATIVE ng/mL
BENZODIAZEPINE SCREEN, URINE: NEGATIVE ng/mL
Barbiturate Screen, Urine: NEGATIVE ng/mL
Buprenorphine, Urine: NEGATIVE ng/mL
COCAINE METABOLITES: NEGATIVE ng/mL
CREATININE, URINE: 263.56 mg/dL (ref >20.0)
Carisoprodol, Urine: NEGATIVE ng/mL
ECSTASY: NEGATIVE ng/mL
FENTANYL URINE: NEGATIVE ng/mL
Meperidine, Ur: NEGATIVE ng/mL
Methadone Screen, Urine: NEGATIVE ng/mL
Methaqualone: NEGATIVE ng/mL
Nitrites, Initial: NEGATIVE ug/mL
OPIATE SCREEN, URINE: NEGATIVE ng/mL
Oxycodone Screen, Ur: NEGATIVE ng/mL
PHENCYCLIDINE, UR: NEGATIVE ng/mL
Propoxyphene: NEGATIVE ng/mL
TRAMADOL UR: NEGATIVE ng/mL
Tapentadol, urine: NEGATIVE ng/mL
ZOLPIDEM, URINE: NEGATIVE ng/mL
pH, Initial: 7.7 pH (ref 4.5–8.9)

## 2013-12-21 LAB — CULTURE, OB URINE: Colony Count: 9000

## 2013-12-21 LAB — CANNABANOIDS (GC/LC/MS), URINE: THC-COOH UR CONFIRM: 430 ng/mL — AB

## 2013-12-27 ENCOUNTER — Ambulatory Visit (HOSPITAL_COMMUNITY): Payer: Medicaid Other

## 2013-12-27 ENCOUNTER — Ambulatory Visit (HOSPITAL_COMMUNITY): Admission: RE | Admit: 2013-12-27 | Payer: Medicaid Other | Source: Ambulatory Visit

## 2014-01-02 ENCOUNTER — Ambulatory Visit (INDEPENDENT_AMBULATORY_CARE_PROVIDER_SITE_OTHER): Payer: Medicaid Other | Admitting: Obstetrics and Gynecology

## 2014-01-02 VITALS — BP 128/89 | Temp 97.9°F | Wt 203.6 lb

## 2014-01-02 DIAGNOSIS — O0932 Supervision of pregnancy with insufficient antenatal care, second trimester: Secondary | ICD-10-CM

## 2014-01-02 DIAGNOSIS — O0930 Supervision of pregnancy with insufficient antenatal care, unspecified trimester: Secondary | ICD-10-CM

## 2014-01-02 DIAGNOSIS — O26879 Cervical shortening, unspecified trimester: Secondary | ICD-10-CM

## 2014-01-02 DIAGNOSIS — Z23 Encounter for immunization: Secondary | ICD-10-CM

## 2014-01-02 LAB — POCT URINALYSIS DIP (DEVICE)
BILIRUBIN URINE: NEGATIVE
Glucose, UA: NEGATIVE mg/dL
HGB URINE DIPSTICK: NEGATIVE
Ketones, ur: NEGATIVE mg/dL
NITRITE: NEGATIVE
PH: 7 (ref 5.0–8.0)
PROTEIN: 30 mg/dL — AB
Specific Gravity, Urine: 1.025 (ref 1.005–1.030)
Urobilinogen, UA: 1 mg/dL (ref 0.0–1.0)

## 2014-01-02 MED ORDER — PRENATAL PLUS 27-1 MG PO TABS: 1.0000 | ORAL_TABLET | Freq: Every day | ORAL | Status: DC

## 2014-01-02 NOTE — Progress Notes (Signed)
Evaluation and management procedures were performed by Resident physician under my supervision/collaboration. Chart reviewed, patient examined by me and I agree with management and plan. Danae Orleanseirdre C Siraj Dermody, CNM 01/02/2014 2:58 PM

## 2014-01-02 NOTE — Progress Notes (Signed)
Patient complaining of pelvis pain. It occurs in her inner thighs and suprapubicly. It occurs most days and most of the day. Nothing relives the pain and she hasn't taken anything for it. She does also have cramping but this lasts less than 5 minutes. Patient plans on having the baby adopted. She has gone through an agency and he will go to an extended family member.  The family members insurance should cover the circumcision.  She has stopped using marijuana and does not have any nausea or vomiting.  She has decreased appetite with only eating one meal throughout the day.   Plan:  Pain most likely related to round ligament pain.  encourage small meals throught the day. Refill of PNV

## 2014-01-02 NOTE — Progress Notes (Signed)
P= 84 Requests PNV refill. C/o of intermittent lower abdominal/pelvic pain/cramping.

## 2014-01-16 ENCOUNTER — Encounter: Payer: Medicaid Other | Admitting: Family Medicine

## 2014-01-30 ENCOUNTER — Ambulatory Visit (INDEPENDENT_AMBULATORY_CARE_PROVIDER_SITE_OTHER): Payer: Medicaid Other | Admitting: Family Medicine

## 2014-01-30 ENCOUNTER — Encounter (HOSPITAL_COMMUNITY): Payer: Self-pay | Admitting: *Deleted

## 2014-01-30 ENCOUNTER — Inpatient Hospital Stay (HOSPITAL_COMMUNITY)
Admission: AD | Admit: 2014-01-30 | Discharge: 2014-01-30 | Disposition: A | Discharge status: Home / Self Care | Payer: Medicaid Other | Source: Ambulatory Visit | Attending: Obstetrics & Gynecology | Admitting: Obstetrics & Gynecology

## 2014-01-30 ENCOUNTER — Encounter: Payer: Self-pay | Admitting: Family Medicine

## 2014-01-30 VITALS — BP 130/77 | Temp 97.6°F | Wt 216.4 lb

## 2014-01-30 DIAGNOSIS — F1290 Cannabis use, unspecified, uncomplicated: Secondary | ICD-10-CM

## 2014-01-30 DIAGNOSIS — O26879 Cervical shortening, unspecified trimester: Secondary | ICD-10-CM

## 2014-01-30 DIAGNOSIS — N898 Other specified noninflammatory disorders of vagina: Secondary | ICD-10-CM

## 2014-01-30 DIAGNOSIS — O26899 Other specified pregnancy related conditions, unspecified trimester: Secondary | ICD-10-CM

## 2014-01-30 DIAGNOSIS — A499 Bacterial infection, unspecified: Secondary | ICD-10-CM | POA: Insufficient documentation

## 2014-01-30 DIAGNOSIS — B9689 Other specified bacterial agents as the cause of diseases classified elsewhere: Secondary | ICD-10-CM | POA: Insufficient documentation

## 2014-01-30 DIAGNOSIS — N760 Acute vaginitis: Secondary | ICD-10-CM | POA: Insufficient documentation

## 2014-01-30 DIAGNOSIS — Z6791 Unspecified blood type, Rh negative: Secondary | ICD-10-CM

## 2014-01-30 DIAGNOSIS — O0930 Supervision of pregnancy with insufficient antenatal care, unspecified trimester: Secondary | ICD-10-CM

## 2014-01-30 DIAGNOSIS — O2390 Unspecified genitourinary tract infection in pregnancy, unspecified trimester: Secondary | ICD-10-CM | POA: Insufficient documentation

## 2014-01-30 DIAGNOSIS — O360990 Maternal care for other rhesus isoimmunization, unspecified trimester, not applicable or unspecified: Secondary | ICD-10-CM

## 2014-01-30 DIAGNOSIS — F1210 Cannabis abuse, uncomplicated: Secondary | ICD-10-CM

## 2014-01-30 DIAGNOSIS — O479 False labor, unspecified: Secondary | ICD-10-CM

## 2014-01-30 DIAGNOSIS — O0932 Supervision of pregnancy with insufficient antenatal care, second trimester: Secondary | ICD-10-CM

## 2014-01-30 LAB — POCT URINALYSIS DIP (DEVICE)
Bilirubin Urine: NEGATIVE
Glucose, UA: NEGATIVE mg/dL
Hgb urine dipstick: NEGATIVE
Ketones, ur: NEGATIVE mg/dL
Nitrite: NEGATIVE
Protein, ur: NEGATIVE mg/dL
Specific Gravity, Urine: 1.02 (ref 1.005–1.030)
Urobilinogen, UA: 0.2 mg/dL (ref 0.0–1.0)
pH: 7 (ref 5.0–8.0)

## 2014-01-30 LAB — URINALYSIS, ROUTINE W REFLEX MICROSCOPIC
BILIRUBIN URINE: NEGATIVE
Glucose, UA: NEGATIVE mg/dL
HGB URINE DIPSTICK: NEGATIVE
KETONES UR: NEGATIVE mg/dL
Leukocytes, UA: NEGATIVE
NITRITE: NEGATIVE
Protein, ur: NEGATIVE mg/dL
SPECIFIC GRAVITY, URINE: 1.015 (ref 1.005–1.030)
Urobilinogen, UA: 0.2 mg/dL (ref 0.0–1.0)
pH: 7 (ref 5.0–8.0)

## 2014-01-30 LAB — OB RESULTS CONSOLE GC/CHLAMYDIA
Chlamydia: NEGATIVE
GC PROBE AMP, GENITAL: NEGATIVE

## 2014-01-30 LAB — WET PREP, GENITAL
TRICH WET PREP: NONE SEEN
Yeast Wet Prep HPF POC: NONE SEEN

## 2014-01-30 MED ORDER — METRONIDAZOLE 500 MG PO TABS: 500.0000 mg | ORAL_TABLET | Freq: Two times a day (BID) | ORAL | Status: DC

## 2014-01-30 NOTE — MAU Note (Signed)
Pt states U/C's started about 0300 with contractions being about every 5-6 min.  Denies vaginal bleeding or ROM.  Pt does c/o thick gray vaginal discharge x 1 week.  Good fetal movement.  Next appt is at 1045 today in the clinic.

## 2014-01-30 NOTE — Progress Notes (Signed)
P= 75 C/o of intermittent lower abdominal/pelvic pressure and braxton hicks.  C/o of gray vaginal discharge; seen in MAU this morning and prescribed flagyl for BV.

## 2014-01-30 NOTE — Progress Notes (Signed)
28 yo G4P1021 @ 484w3d here for ROBV.  -seen in MAU today for vag discharge. Exam and NST done  - due for cultures.  - no ctx, lof, vb. +FM  O: see flowsheet  A/P - reviewed MAU note.  - GBS collected today - discussed we do not induce until 41 weeks - f/u in 1 week.

## 2014-01-30 NOTE — Discharge Instructions (Signed)
Reasons to return to MAU:  1.  Contractions are  5 minutes apart or less, each last 1 minute, these have been going on for 1-2 hours, and you cannot walk or talk during them 2.  You have a large gush of fluid, or a trickle of fluid that will not stop and you have to wear a pad 3.  You have bleeding that is bright red, heavier than spotting--like menstrual bleeding (spotting can be normal in early labor or after a check of your cervix) 4.  You do not feel the baby moving like he/she normally does  Bacterial Vaginosis Bacterial vaginosis is a vaginal infection that occurs when the normal balance of bacteria in the vagina is disrupted. It results from an overgrowth of certain bacteria. This is the most common vaginal infection in women of childbearing age. Treatment is important to prevent complications, especially in pregnant women, as it can cause a premature delivery. CAUSES  Bacterial vaginosis is caused by an increase in harmful bacteria that are normally present in smaller amounts in the vagina. Several different kinds of bacteria can cause bacterial vaginosis. However, the reason that the condition develops is not fully understood. RISK FACTORS Certain activities or behaviors can put you at an increased risk of developing bacterial vaginosis, including:  Having a new sex partner or multiple sex partners.  Douching.  Using an intrauterine device (IUD) for contraception. Women do not get bacterial vaginosis from toilet seats, bedding, swimming pools, or contact with objects around them. SIGNS AND SYMPTOMS  Some women with bacterial vaginosis have no signs or symptoms. Common symptoms include:  Grey vaginal discharge.  A fishlike odor with discharge, especially after sexual intercourse.  Itching or burning of the vagina and vulva.  Burning or pain with urination. DIAGNOSIS  Your health care provider will take a medical history and examine the vagina for signs of bacterial vaginosis. A  sample of vaginal fluid may be taken. Your health care provider will look at this sample under a microscope to check for bacteria and abnormal cells. A vaginal pH test may also be done.  TREATMENT  Bacterial vaginosis may be treated with antibiotic medicines. These may be given in the form of a pill or a vaginal cream. A second round of antibiotics may be prescribed if the condition comes back after treatment.  HOME CARE INSTRUCTIONS   Only take over-the-counter or prescription medicines as directed by your health care provider.  If antibiotic medicine was prescribed, take it as directed. Make sure you finish it even if you start to feel better.  Do not have sex until treatment is completed.  Tell all sexual partners that you have a vaginal infection. They should see their health care provider and be treated if they have problems, such as a mild rash or itching.  Practice safe sex by using condoms and only having one sex partner. SEEK MEDICAL CARE IF:   Your symptoms are not improving after 3 days of treatment.  You have increased discharge or pain.  You have a fever. MAKE SURE YOU:   Understand these instructions.  Will watch your condition.  Will get help right away if you are not doing well or get worse. FOR MORE INFORMATION  Centers for Disease Control and Prevention, Division of STD Prevention: SolutionApps.co.zawww.cdc.gov/std American Sexual Health Association (ASHA): www.ashastd.org  Document Released: 10/25/2005 Document Revised: 08/15/2013 Document Reviewed: 06/06/2013 [employer] Patient Information 2014 [place], [place].

## 2014-01-30 NOTE — MAU Provider Note (Signed)
Chief Complaint:  Labor Eval   First Provider Initiated Contact with Patient 01/30/14 [phone]      HPI: Tracie Valenzuela is a 28 y.o. G4P1021 at [redacted]w[redacted]d who presents to maternity admissions reporting painful contractions, but she is unsure how often, and grey vaginal discharge x1 week.  She has clinic appointment later this morning but came early to be evaluated in MAU.  She reports good fetal movement, denies LOF, vaginal bleeding, vaginal itching/burning, urinary symptoms, h/a, dizziness, n/v, or fever/chills.     Past Medical History: Past Medical History  Diagnosis Date  . Medical history non-contributory     Past obstetric history: OB History  Gravida Para Term Preterm AB SAB TAB Ectopic Multiple Living  4 1 1  0 2 0 2 0 0 1    # Outcome Date GA Lbr Len/2nd Weight Sex Delivery Anes PTL Lv  4 CUR           3 TRM 02/24/06 [redacted]w[redacted]d  3.345 kg (7 lb 6 oz) F SVD EPI N Y  2 TAB           1 TAB               Past Surgical History: Past Surgical History  Procedure Laterality Date  . Meniscus repair    . Achilles tendon repair      Family History: Family History  Problem Relation Age of Onset  . Hypertension Paternal Grandmother   . Diabetes Paternal Grandmother     Social History: History  Substance Use Topics  . Smoking status: Never Smoker   . Smokeless tobacco: Never Used  . Alcohol Use: No    Allergies: No Known Allergies  Meds:  No prescriptions prior to admission    ROS: Pertinent findings in history of present illness.  Physical Exam  Blood pressure 114/70, pulse 89, temperature 98.2 F (36.8 C), temperature source Oral, resp. rate 18, height 5\' 10"  (1.778 m), weight 98.703 kg (217 lb 9.6 oz), SpO2 99.00%. GENERAL: Well-developed, well-nourished female in no acute distress.  HEENT: normocephalic HEART: normal rate RESP: normal effort ABDOMEN: Soft, non-tender, gravid appropriate for gestational age EXTREMITIES: Nontender, no edema NEURO: alert and  oriented Pelvic exam: Pt unable to tolerate speculum exam, wet prep/GCC collected with blind swab, moderate amount white thin discharge noted at introitus, external genitalia normal  Dilation: 2 Effacement (%): 50 Cervical Position: Posterior Station: -3 Presentation: Vertex Exam by:: L. Leftwhich-Kerby, CNM  FHT:  Baseline 135 , moderate variability, accelerations present, isolated variable lasting 15 seconds followed by 20+ minutes of Category I FHR tracing Contractions: Irritability, irregular on toco, mild to palpation   Labs: Results for orders placed during the hospital encounter of 01/30/14 (from the past 24 hour(s))  URINALYSIS, ROUTINE W REFLEX MICROSCOPIC     Status: None   Collection Time    01/30/14  8:53 AM      Result Value Ref Range   Color, Urine YELLOW  YELLOW   APPearance CLEAR  CLEAR   Specific Gravity, Urine 1.015  1.005 - 1.030   pH 7.0  5.0 - 8.0   Glucose, UA NEGATIVE  NEGATIVE mg/dL   Hgb urine dipstick NEGATIVE  NEGATIVE   Bilirubin Urine NEGATIVE  NEGATIVE   Ketones, ur NEGATIVE  NEGATIVE mg/dL   Protein, ur NEGATIVE  NEGATIVE mg/dL   Urobilinogen, UA 0.2  0.0 - 1.0 mg/dL   Nitrite NEGATIVE  NEGATIVE   Leukocytes, UA NEGATIVE  NEGATIVE  WET PREP, GENITAL  Status: Abnormal   Collection Time    01/30/14  9:30 AM      Result Value Ref Range   Yeast Wet Prep HPF POC NONE SEEN  NONE SEEN   Trich, Wet Prep NONE SEEN  NONE SEEN   Clue Cells Wet Prep HPF POC MODERATE (*) NONE SEEN   WBC, Wet Prep HPF POC FEW (*) NONE SEEN    Assessment: 1. Vaginal discharge in pregnancy   2. Threatened labor at term   3. BV (bacterial vaginosis)     Plan: Discharge home Labor precautions and fetal kick counts Flagyl 500 mg BID x7 days Keep scheduled clinic visit today Return to MAU as needed      Follow-up Information   [facility] (As scheduled today. Return to MAU as needed.)    Specialty:  Obstetrics and Gynecology    Contact information:   [address] [place] [id number] [phone]       Medication List         acetaminophen 325 MG tablet  Commonly known as:  TYLENOL  Take 650 mg by mouth every 6 (six) hours as needed for moderate pain or headache.     metroNIDAZOLE 500 MG tablet  Commonly known as:  FLAGYL  Take 1 tablet (500 mg total) by mouth 2 (two) times daily.     prenatal multivitamin Tabs tablet  Take 1 tablet by mouth daily at 12 noon.        Sharen CounterLisa Leftwich-Kirby Certified Nurse-Midwife 01/30/2014 11:43 AM

## 2014-01-30 NOTE — Patient Instructions (Signed)
Third Trimester of Pregnancy  The third trimester is from week 29 through week 42, months 7 through 9. The third trimester is a time when the fetus is growing rapidly. At the end of the ninth month, the fetus is about 20 inches in length and weighs 6 10 pounds.   BODY CHANGES  Your body goes through many changes during pregnancy. The changes vary from woman to woman.    Your weight will continue to increase. You can expect to gain 25 35 pounds (11 16 kg) by the end of the pregnancy.   You may begin to get stretch marks on your hips, abdomen, and breasts.   You may urinate more often because the fetus is moving lower into your pelvis and pressing on your bladder.   You may develop or continue to have heartburn as a result of your pregnancy.   You may develop constipation because certain hormones are causing the muscles that push waste through your intestines to slow down.   You may develop hemorrhoids or swollen, bulging veins (varicose veins).   You may have pelvic pain because of the weight gain and pregnancy hormones relaxing your joints between the bones in your pelvis. Back aches may result from over exertion of the muscles supporting your posture.   Your breasts will continue to grow and be tender. A yellow discharge may leak from your breasts called colostrum.   Your belly button may stick out.   You may feel short of breath because of your expanding uterus.   You may notice the fetus "dropping," or moving lower in your abdomen.   You may have a bloody mucus discharge. This usually occurs a few days to a week before labor begins.   Your cervix becomes thin and soft (effaced) near your due date.  WHAT TO EXPECT AT YOUR PRENATAL EXAMS   You will have prenatal exams every 2 weeks until week 36. Then, you will have weekly prenatal exams. During a routine prenatal visit:   You will be weighed to make sure you and the fetus are growing normally.   Your blood pressure is taken.   Your abdomen will be  measured to track your baby's growth.   The fetal heartbeat will be listened to.   Any test results from the previous visit will be discussed.   You may have a cervical check near your due date to see if you have effaced.  At around 36 weeks, your caregiver will check your cervix. At the same time, your caregiver will also perform a test on the secretions of the vaginal tissue. This test is to determine if a type of bacteria, Group B streptococcus, is present. Your caregiver will explain this further.  Your caregiver may ask you:   What your birth plan is.   How you are feeling.   If you are feeling the baby move.   If you have had any abnormal symptoms, such as leaking fluid, bleeding, severe headaches, or abdominal cramping.   If you have any questions.  Other tests or screenings that may be performed during your third trimester include:   Blood tests that check for low iron levels (anemia).   Fetal testing to check the health, activity level, and growth of the fetus. Testing is done if you have certain medical conditions or if there are problems during the pregnancy.  FALSE LABOR  You may feel small, irregular contractions that eventually go away. These are called Braxton Hicks contractions, or   false labor. Contractions may last for hours, days, or even weeks before true labor sets in. If contractions come at regular intervals, intensify, or become painful, it is best to be seen by your caregiver.   SIGNS OF LABOR    Menstrual-like cramps.   Contractions that are 5 minutes apart or less.   Contractions that start on the top of the uterus and spread down to the lower abdomen and back.   A sense of increased pelvic pressure or back pain.   A watery or bloody mucus discharge that comes from the vagina.  If you have any of these signs before the 37th week of pregnancy, call your caregiver right away. You need to go to the hospital to get checked immediately.  HOME CARE INSTRUCTIONS    Avoid all  smoking, herbs, alcohol, and unprescribed drugs. These chemicals affect the formation and growth of the baby.   Follow your caregiver's instructions regarding medicine use. There are medicines that are either safe or unsafe to take during pregnancy.   Exercise only as directed by your caregiver. Experiencing uterine cramps is a good sign to stop exercising.   Continue to eat regular, healthy meals.   Wear a good support bra for breast tenderness.   Do not use hot tubs, steam rooms, or saunas.   Wear your seat belt at all times when driving.   Avoid raw meat, uncooked cheese, cat litter boxes, and soil used by cats. These carry germs that can cause birth defects in the baby.   Take your prenatal vitamins.   Try taking a stool softener (if your caregiver approves) if you develop constipation. Eat more high-fiber foods, such as fresh vegetables or fruit and whole grains. Drink plenty of fluids to keep your urine clear or pale yellow.   Take warm sitz baths to soothe any pain or discomfort caused by hemorrhoids. Use hemorrhoid cream if your caregiver approves.   If you develop varicose veins, wear support hose. Elevate your feet for 15 minutes, 3 4 times a day. Limit salt in your diet.   Avoid heavy lifting, wear low heal shoes, and practice good posture.   Rest a lot with your legs elevated if you have leg cramps or low back pain.   Visit your dentist if you have not gone during your pregnancy. Use a soft toothbrush to brush your teeth and be gentle when you floss.   A sexual relationship may be continued unless your caregiver directs you otherwise.   Do not travel far distances unless it is absolutely necessary and only with the approval of your caregiver.   Take prenatal classes to understand, practice, and ask questions about the labor and delivery.   Make a trial run to the hospital.   Pack your hospital bag.   Prepare the baby's nursery.   Continue to go to all your prenatal visits as directed  by your caregiver.  SEEK MEDICAL CARE IF:   You are unsure if you are in labor or if your water has broken.   You have dizziness.   You have mild pelvic cramps, pelvic pressure, or nagging pain in your abdominal area.   You have persistent nausea, vomiting, or diarrhea.   You have a bad smelling vaginal discharge.   You have pain with urination.  SEEK IMMEDIATE MEDICAL CARE IF:    You have a fever.   You are leaking fluid from your vagina.   You have spotting or bleeding from your vagina.     You have severe abdominal cramping or pain.   You have rapid weight loss or gain.   You have shortness of breath with chest pain.   You notice sudden or extreme swelling of your face, hands, ankles, feet, or legs.   You have not felt your baby move in over an hour.   You have severe headaches that do not go away with medicine.   You have vision changes.  Document Released: 10/19/2001 Document Revised: 06/27/2013 Document Reviewed: 12/26/2012  ExitCare Patient Information 2014 Exit[employer], LLC.

## 2014-01-31 LAB — GC/CHLAMYDIA PROBE AMP
CT Probe RNA: NEGATIVE
GC Probe RNA: NEGATIVE

## 2014-02-01 ENCOUNTER — Encounter: Payer: Self-pay | Admitting: Family Medicine

## 2014-02-03 LAB — CULTURE, STREPTOCOCCUS GRP B W/SUSCEPT

## 2014-02-06 ENCOUNTER — Encounter: Payer: Self-pay | Admitting: Advanced Practice Midwife

## 2014-02-06 ENCOUNTER — Encounter: Payer: Medicaid Other | Admitting: Advanced Practice Midwife

## 2014-02-11 ENCOUNTER — Inpatient Hospital Stay (HOSPITAL_COMMUNITY)
Admission: AD | Admit: 2014-02-11 | Discharge: 2014-02-11 | DRG: 775 | Disposition: A | Discharge status: Home / Self Care | Payer: Medicaid Other | Source: Ambulatory Visit | Attending: Obstetrics and Gynecology | Admitting: Obstetrics and Gynecology

## 2014-02-11 ENCOUNTER — Encounter (HOSPITAL_COMMUNITY): Payer: Medicaid Other | Admitting: Anesthesiology

## 2014-02-11 ENCOUNTER — Encounter (HOSPITAL_COMMUNITY): Payer: Self-pay | Admitting: *Deleted

## 2014-02-11 ENCOUNTER — Inpatient Hospital Stay (HOSPITAL_COMMUNITY): Payer: Medicaid Other | Admitting: Anesthesiology

## 2014-02-11 DIAGNOSIS — O99344 Other mental disorders complicating childbirth: Secondary | ICD-10-CM

## 2014-02-11 DIAGNOSIS — O26879 Cervical shortening, unspecified trimester: Secondary | ICD-10-CM

## 2014-02-11 DIAGNOSIS — F1210 Cannabis abuse, uncomplicated: Secondary | ICD-10-CM

## 2014-02-11 DIAGNOSIS — O9989 Other specified diseases and conditions complicating pregnancy, childbirth and the puerperium: Secondary | ICD-10-CM

## 2014-02-11 DIAGNOSIS — F1290 Cannabis use, unspecified, uncomplicated: Secondary | ICD-10-CM

## 2014-02-11 DIAGNOSIS — Z22330 Carrier of Group B streptococcus: Secondary | ICD-10-CM

## 2014-02-11 DIAGNOSIS — O26899 Other specified pregnancy related conditions, unspecified trimester: Secondary | ICD-10-CM

## 2014-02-11 DIAGNOSIS — O99892 Other specified diseases and conditions complicating childbirth: Secondary | ICD-10-CM | POA: Diagnosis present

## 2014-02-11 DIAGNOSIS — O0932 Supervision of pregnancy with insufficient antenatal care, second trimester: Secondary | ICD-10-CM

## 2014-02-11 DIAGNOSIS — Z6791 Unspecified blood type, Rh negative: Secondary | ICD-10-CM

## 2014-02-11 LAB — CBC
HCT: 34.1 % — ABNORMAL LOW (ref 36.0–46.0)
Hemoglobin: 11.5 g/dL — ABNORMAL LOW (ref 12.0–15.0)
MCH: 29 pg (ref 26.0–34.0)
MCHC: 33.7 g/dL (ref 30.0–36.0)
MCV: 85.9 fL (ref 78.0–100.0)
PLATELETS: 226 10*3/uL (ref 150–400)
RBC: 3.97 MIL/uL (ref 3.87–5.11)
RDW: 13.6 % (ref 11.5–15.5)
WBC: 15 10*3/uL — ABNORMAL HIGH (ref 4.0–10.5)

## 2014-02-11 LAB — RPR: RPR: NONREACTIVE

## 2014-02-11 LAB — ABO/RH: ABO/RH(D): A NEG

## 2014-02-11 LAB — OB RESULTS CONSOLE GBS: STREP GROUP B AG: POSITIVE

## 2014-02-11 MED ORDER — RHO D IMMUNE GLOBULIN 1500 UNIT/2ML IJ SOLN
300.0000 ug | Freq: Once | INTRAMUSCULAR | Status: DC
  Filled 2014-02-11: qty 2

## 2014-02-11 MED ORDER — ZOLPIDEM TARTRATE 5 MG PO TABS: 5.0000 mg | ORAL_TABLET | Freq: Every evening | ORAL | Status: DC | PRN

## 2014-02-11 MED ORDER — EPHEDRINE 5 MG/ML INJ
10.0000 mg | INTRAVENOUS | Status: DC | PRN
  Filled 2014-02-11: qty 2

## 2014-02-11 MED ORDER — LIDOCAINE HCL (PF) 1 % IJ SOLN
30.0000 mL | INTRAMUSCULAR | Status: DC | PRN
  Filled 2014-02-11: qty 30

## 2014-02-11 MED ORDER — FLEET ENEMA 7-19 GM/118ML RE ENEM: 1.0000 | ENEMA | RECTAL | Status: DC | PRN

## 2014-02-11 MED ORDER — OXYTOCIN 40 UNITS IN LACTATED RINGERS INFUSION - SIMPLE MED
62.5000 mL/h | INTRAVENOUS | Status: DC
  Filled 2014-02-11: qty 1000

## 2014-02-11 MED ORDER — ONDANSETRON HCL 4 MG/2ML IJ SOLN: 4.0000 mg | Freq: Four times a day (QID) | INTRAMUSCULAR | Status: DC | PRN

## 2014-02-11 MED ORDER — ACETAMINOPHEN 325 MG PO TABS: 650.0000 mg | ORAL_TABLET | ORAL | Status: DC | PRN

## 2014-02-11 MED ORDER — OXYCODONE-ACETAMINOPHEN 5-325 MG PO TABS: 1.0000 | ORAL_TABLET | ORAL | Status: DC | PRN

## 2014-02-11 MED ORDER — SODIUM CHLORIDE 0.9 % IV SOLN
2.0000 g | Freq: Once | INTRAVENOUS | Status: AC
  Administered 2014-02-11: 2 g via INTRAVENOUS
  Filled 2014-02-11: qty 2000

## 2014-02-11 MED ORDER — CITRIC ACID-SODIUM CITRATE 334-500 MG/5ML PO SOLN: 30.0000 mL | ORAL | Status: DC | PRN

## 2014-02-11 MED ORDER — PRENATAL MULTIVITAMIN CH: 1.0000 | ORAL_TABLET | Freq: Every day | ORAL | Status: DC

## 2014-02-11 MED ORDER — BENZOCAINE-MENTHOL 20-0.5 % EX AERO: 1.0000 "application " | INHALATION_SPRAY | CUTANEOUS | Status: DC | PRN

## 2014-02-11 MED ORDER — SENNOSIDES-DOCUSATE SODIUM 8.6-50 MG PO TABS: 2.0000 | ORAL_TABLET | ORAL | Status: DC

## 2014-02-11 MED ORDER — IBUPROFEN 600 MG PO TABS
600.0000 mg | ORAL_TABLET | Freq: Four times a day (QID) | ORAL | Status: DC | PRN
  Administered 2014-02-11: 600 mg via ORAL

## 2014-02-11 MED ORDER — DIPHENHYDRAMINE HCL 50 MG/ML IJ SOLN: 12.5000 mg | INTRAMUSCULAR | Status: DC | PRN

## 2014-02-11 MED ORDER — IBUPROFEN 600 MG PO TABS
600.0000 mg | ORAL_TABLET | Freq: Four times a day (QID) | ORAL | Status: DC
  Filled 2014-02-11: qty 1

## 2014-02-11 MED ORDER — LACTATED RINGERS IV SOLN
500.0000 mL | Freq: Once | INTRAVENOUS | Status: DC
Start: 2014-02-11 — End: 2014-02-11

## 2014-02-11 MED ORDER — FENTANYL 2.5 MCG/ML BUPIVACAINE 1/10 % EPIDURAL INFUSION (WH - ANES)
INTRAMUSCULAR | Status: AC
  Filled 2014-02-11: qty 125

## 2014-02-11 MED ORDER — OXYTOCIN BOLUS FROM INFUSION
500.0000 mL | INTRAVENOUS | Status: DC
  Administered 2014-02-11: 500 mL via INTRAVENOUS

## 2014-02-11 MED ORDER — LACTATED RINGERS IV SOLN
INTRAVENOUS | Status: DC
Start: 2014-02-11 — End: 2014-02-11

## 2014-02-11 MED ORDER — PHENYLEPHRINE 40 MCG/ML (10ML) SYRINGE FOR IV PUSH (FOR BLOOD PRESSURE SUPPORT)
PREFILLED_SYRINGE | INTRAVENOUS | Status: AC
  Filled 2014-02-11: qty 10

## 2014-02-11 MED ORDER — EPHEDRINE 5 MG/ML INJ
INTRAVENOUS | Status: AC
  Filled 2014-02-11: qty 4

## 2014-02-11 MED ORDER — LIDOCAINE HCL (PF) 1 % IJ SOLN
INTRAMUSCULAR | Status: DC | PRN
  Administered 2014-02-11: 9 mL
  Administered 2014-02-11: 8 mL

## 2014-02-11 MED ORDER — DIPHENHYDRAMINE HCL 25 MG PO CAPS: 25.0000 mg | ORAL_CAPSULE | Freq: Four times a day (QID) | ORAL | Status: DC | PRN

## 2014-02-11 MED ORDER — SIMETHICONE 80 MG PO CHEW: 80.0000 mg | CHEWABLE_TABLET | ORAL | Status: DC | PRN

## 2014-02-11 MED ORDER — ONDANSETRON HCL 4 MG/2ML IJ SOLN: 4.0000 mg | INTRAMUSCULAR | Status: DC | PRN

## 2014-02-11 MED ORDER — LANOLIN HYDROUS EX OINT: TOPICAL_OINTMENT | CUTANEOUS | Status: DC | PRN

## 2014-02-11 MED ORDER — WITCH HAZEL-GLYCERIN EX PADS: 1.0000 "application " | MEDICATED_PAD | CUTANEOUS | Status: DC | PRN

## 2014-02-11 MED ORDER — TETANUS-DIPHTH-ACELL PERTUSSIS 5-2.5-18.5 LF-MCG/0.5 IM SUSP: 0.5000 mL | Freq: Once | INTRAMUSCULAR | Status: DC

## 2014-02-11 MED ORDER — PHENYLEPHRINE 40 MCG/ML (10ML) SYRINGE FOR IV PUSH (FOR BLOOD PRESSURE SUPPORT)
80.0000 ug | PREFILLED_SYRINGE | INTRAVENOUS | Status: DC | PRN
  Filled 2014-02-11: qty 2

## 2014-02-11 MED ORDER — LACTATED RINGERS IV SOLN: 500.0000 mL | INTRAVENOUS | Status: DC | PRN

## 2014-02-11 MED ORDER — DIBUCAINE 1 % RE OINT: 1.0000 "application " | TOPICAL_OINTMENT | RECTAL | Status: DC | PRN

## 2014-02-11 MED ORDER — IBUPROFEN 600 MG PO TABS: 600.0000 mg | ORAL_TABLET | Freq: Four times a day (QID) | ORAL | Status: DC | PRN

## 2014-02-11 MED ORDER — ONDANSETRON HCL 4 MG PO TABS: 4.0000 mg | ORAL_TABLET | ORAL | Status: DC | PRN

## 2014-02-11 MED ORDER — FENTANYL 2.5 MCG/ML BUPIVACAINE 1/10 % EPIDURAL INFUSION (WH - ANES): 14.0000 mL/h | INTRAMUSCULAR | Status: DC | PRN

## 2014-02-11 MED ORDER — FENTANYL 2.5 MCG/ML BUPIVACAINE 1/10 % EPIDURAL INFUSION (WH - ANES)
INTRAMUSCULAR | Status: DC | PRN
  Administered 2014-02-11: 14 mL/h via EPIDURAL

## 2014-02-11 NOTE — Progress Notes (Signed)
Patient has signed Transfer of Custody, which has been approved by hospital attorney assistant/Beth Cox as valid for discharge of infant to adoptive parents.  Copy has been placed in birth mother's chart and original in baby's chart.  HIPAA release forms are also placed in charts.  CSW provided birth mother's attorney with L&D summary and baby's admission summary for [facility].  CSW will fax copy of baby's discharge summary to birth mother's attorney for this purpose once baby is discharged.  CSW informed CN staff, [facility] staff, [facility] and Visual merchandiserecurity staff.

## 2014-02-11 NOTE — Progress Notes (Signed)
RN called to room by Glennon Macheresa Tomlinson. Pt stated that she was ready to go home and she wanted the baby taken to the nursery. NMB transported to nursery by this Charity fundraiserN. Social work called. Chaplain called. Rulon AbideKeli Beck, MD called. Steps taken to fulfill pt's request for early discharge.

## 2014-02-11 NOTE — Progress Notes (Signed)
Pt left hospital AMA stating "I can't wait any longer.  I'm out of here."  RN reviewed reasons for staying until after her Rhophylac injection and patient refused.  Pt signed the AMA papers and ambulated off unit.  Phoned Dr. Reola CalkinsBeck with above.  She stated she would put a note on the chart indicating that the patient will need Rhophylac at there follow-up appointment tomorrow.

## 2014-02-11 NOTE — Progress Notes (Signed)
Patient at desk stating that she would like to leave now. Requesting AMA forms. RN called Dr. Reola CalkinsBeck regarding discharge. Dr. Reola CalkinsBeck told RN that it would be 15 minutes and patient verbalized understanding and agreed to stay until MD could get to unit for discharge.

## 2014-02-11 NOTE — Anesthesia Postprocedure Evaluation (Signed)
Anesthesia Post Note  Patient: Tracie Valenzuela  Procedure(s) Performed: * No procedures listed *  Anesthesia type: Epidural  Patient location: Mother/Baby  Post pain: Pain level controlled  Post assessment: Post-op Vital signs reviewed  Last Vitals:  Filed Vitals:   02/11/14 1017  BP: 125/74  Pulse: 60  Temp: 36.9 C  Resp: 18    Post vital signs: Reviewed  Level of consciousness:alert  Complications: No apparent anesthesia complications

## 2014-02-11 NOTE — Anesthesia Procedure Notes (Signed)
Epidural Patient location during procedure: OB Start time: 02/11/2014 6:09 AM End time: 02/11/2014 6:13 AM  Staffing Anesthesiologist: Leilani AbleHATCHETT, Mashayla Lavin  Preanesthetic Checklist Completed: patient identified, surgical consent, pre-op evaluation, timeout performed, IV checked, risks and benefits discussed and monitors and equipment checked  Epidural Patient position: sitting Prep: site prepped and draped and DuraPrep Patient monitoring: continuous pulse ox and blood pressure Approach: midline Location: L3-L4 Injection technique: LOR air  Needle:  Needle type: Tuohy  Needle gauge: 17 G Needle length: 9 cm and 9 Needle insertion depth: 6 cm Catheter type: closed end flexible Catheter size: 19 Gauge Catheter at skin depth: 12 cm Test dose: negative and Other  Assessment Sensory level: T9 Events: blood not aspirated, injection not painful, no injection resistance, negative IV test and no paresthesia  Additional Notes Reason for block:procedure for pain

## 2014-02-11 NOTE — Progress Notes (Signed)
CSW met with birth mother in her third floor room to complete assessment based on consult for adoption.  Patient was tearful, but pleasant and open to talking with CSW.  Chaplain was with her when CSW arrived, and patient stated she could stay and we could all talk together.  She reports that she has made an adoption plan and that the adoptive couple are coming from [place] now.  She states they had planned to arrive on April 8th and be here with her until the 12th, when the baby was due, but the baby came early.  She states she felt she needed to have the baby with her since they were not here to care for the baby and now she is "feeling some type of way."  CSW asked her to elaborate on this feeling.  She states she has just spent more time with the baby than she had planned to.  CSW asked her if she has changed her mind about going through with the adoption.  She states she has absolutely not changed her mind.  She states she just wants to leave the hospital, however.  Patient provided CSW with the contact information for her attorney, Kennieth Francois.  CSW contacted him and he states that patient is signing Consent to Adopt under [place] law and he has not received the paperwork from [place] yet.  He states he will come to the hospital at approximately 3pm and have patient sign a Transfer of Custody, which will allow for the baby to be discharged to the adoptive parents prior to the birth mother consenting to adoption.  CSW requested that he send CSW a copy of this for review by the hospital attorney.  Patient contact the adoptive mother so CSW could speak with her by phone.  CSW provided her with CSW's contact information and gave her instructions on what to do when she and her husband get to the hospital tonight in order to spend time with the baby.  Adoptive parents are Shandrist [phone]) and Sharee Pimple [phone]).  Adoptive mother was very Patent attorney.  She states they have a place to stay tonight with a  friend in [place].  CSW spoke with birth mother regarding the importance of post adoption counseling and she states she has already accepted the offer for counseling from her attorney.  CSW provided her with CSW's contact information and asked her to call if she has any questions.  She states no questions, concerns or needs at this time.

## 2014-02-11 NOTE — MAU Note (Signed)
Contractions every 5 minutes since 1 am. Denies vaginal bleeding and leaking of fluid. Reports good fetal movement.

## 2014-02-11 NOTE — Progress Notes (Signed)
Ur chart review completed.  

## 2014-02-11 NOTE — Progress Notes (Signed)
Tracie Valenzuela is continuing her plan for adoption and feels good about her decision, but she is in deep grief over not being able to provide what she wanted to for her son.  Her son is being adopted by people whom she knows well and she is grateful to be able to provide him with that, but she is still grieving.    She is eager to get home and have some space for her own healing and staff are trying to help facilitate that as quickly as possible.  The adoptive [place] way [place] and she has been talking with them on the phone today.  She would not mind seeing them if it is necessary for paperwork, but her preference is to be home before they come.  Please page as needs arise, [phone] 3:15 PM   02/11/14 1500  Clinical Encounter Type  Visited With Patient;Other (Comment) (Social Worker, Lulu Ridingolleen Shaw)  Visit Type Spiritual support  Referral From Nurse  Spiritual Encounters  Spiritual Needs Emotional;Grief support  Stress Factors  Patient Stress Factors (Adoption)

## 2014-02-11 NOTE — Discharge Instructions (Signed)

## 2014-02-11 NOTE — Discharge Summary (Signed)
Obstetric Discharge Summary Reason for Admission: onset of labor Prenatal Procedures: NST Intrapartum Procedures: spontaneous vaginal delivery Postpartum Procedures: none Complications-Operative and Postpartum: none, baby up for adoption   Hemoglobin  Date Value Ref Range Status  02/11/2014 11.5* 12.0 - 15.0 g/dL Final     HCT  Date Value Ref Range Status  02/11/2014 34.1* 36.0 - 46.0 % Final    Physical Exam:  General: alert, cooperative and no distress Lochia: appropriate Uterine Fundus: firm Incision: na DVT Evaluation: No evidence of DVT seen on physical exam.  Discharge Diagnoses: Term Pregnancy-delivered  Discharge Information: Date: 02/11/2014 Activity: unrestricted Diet: routine Medications: PNV and Ibuprofen Condition: stable Instructions: refer to practice specific booklet Discharge to: home Follow-up Information   [facility] weeks.   Specialty:  Obstetrics and Gynecology   Contact information:   [address] [place] [id number] [phone]      Newborn Data: Live born female  Birth Weight: 7 lb 3.9 oz (3286 g) APGAR: 9, 9  Baby staying here. Adoption paperwork signed prior to discharge.    Pt presented in active labor at term and progressed quickly to push and deliver a liveborn female without difficulty. Her postpartum care was uncomplicated and as baby was up for adoption, the pt wanted to leave as soon as possible. Her bleeding and pain were controlled and she was discharged in stable condition.  Paperwork signed for adoption prior to discharge. However, the pt refused to stay for her rhogam shot and thus signed AMA paperwork.  She will come into the office in the AM and a note was sent to clinic for him to get the rhogam there.   Semisi Biela L 02/11/2014, 5:12 PM

## 2014-02-11 NOTE — Progress Notes (Signed)
Oozing of blood noted from epidural site when transferring pt from wheelchair to bed in room 309. Bleeding has already stopped. Site cleaned and guaze and tape applied to site.

## 2014-02-11 NOTE — H&P (Signed)
Tracie Valenzuela is a 28 y.o. female G2P1001 presenting for labor at term. SROM at 0549 after arrival here. [facility]. . Maternal Medical History:  Reason for admission: Nausea.    OB History   Grav Para Term Preterm Abortions TAB SAB Ect Mult Living   4 1 1  0 2 2 0 0 0 1     Past Medical History  Diagnosis Date  . Medical history non-contributory    Past Surgical History  Procedure Laterality Date  . Meniscus repair    . Achilles tendon repair    . No past surgeries     Family History: family history includes Diabetes in her paternal grandmother; Hypertension in her paternal grandmother. Social History:  reports that she has never smoked. She has never used smokeless tobacco. She reports that she uses illicit drugs (Marijuana). She reports that she does not drink alcohol.   Prenatal Transfer Tool  Maternal Diabetes: No Genetic Screening: Normal Maternal Ultrasounds/Referrals: Normal Fetal Ultrasounds or other Referrals:  None Maternal Substance Abuse:  Yes:  Type: Marijuana Significant Maternal Medications:  None Significant Maternal Lab Results:  Lab values include: Group B Strep positive Other Comments:  AMP at 0615  Review of Systems  Gastrointestinal: Positive for abdominal pain. Negative for nausea and vomiting.  Genitourinary: Negative for dysuria.  Neurological: Negative for headaches.    Dilation: 10 Effacement (%): 90 Station: -1 Exam by:: Diedra Jamal Pavon CNM Blood pressure 136/88, pulse 84, temperature 97.5 F (36.4 C), temperature source Oral, resp. rate 18, height 5\' 10"  (1.778 m), weight 97.796 kg (215 lb 9.6 oz), SpO2 100.00%. Maternal Exam:  Uterine Assessment: Contraction strength is firm.  Contraction frequency is regular.   Abdomen: Patient reports no abdominal tenderness. Estimated fetal weight is 7#.   Fetal presentation: vertex  Introitus: Normal vulva. Normal vagina.  Ferning test: not done.  Nitrazine test: not done.  Pelvis: adequate for  delivery.      Fetal Exam Fetal Monitor Review: Mode: ultrasound.   Baseline rate: 140-.  Variability: moderate (6-25 bpm).   Pattern: no decelerations and accelerations present.    Fetal State Assessment: Category I - tracings are normal.     Physical Exam  Constitutional: She is oriented to person, place, and time. She appears well-developed and well-nourished. She appears distressed.  HENT:  Head: Normocephalic.  Eyes: Pupils are equal, round, and reactive to light.  Neck: Normal range of motion.  Cardiovascular: Normal rate, regular rhythm and normal heart sounds.   Respiratory: Effort normal and breath sounds normal.  GI: Soft.  Genitourinary: Vagina normal.  Musculoskeletal: Normal range of motion.  Neurological: She is alert and oriented to person, place, and time.  Skin: Skin is warm and dry.  Psychiatric: She has a normal mood and affect. Her behavior is normal. Judgment and thought content normal.    Prenatal labs: ABO, Rh: A/NEG/-- (01/07 1039) Antibody: NEG (02/11 1619) Rubella: 0.87 (01/07 1039) RPR: NON REAC (01/07 1039)  HBsAg: NEGATIVE (01/07 1039)  HIV: NON REACTIVE (01/07 1039)  GBS: Positive (04/06 0000)  1 hr glu: 80  Assessment/Plan: G2P1001 at 4531w1d in active labor    Stayce Delancy 02/11/2014, 6:35 AM

## 2014-02-11 NOTE — Anesthesia Preprocedure Evaluation (Signed)

## 2014-02-11 NOTE — Discharge Summary (Signed)
Attestation of Attending Supervision of Fellow: Evaluation and management procedures were performed by the Fellow under my supervision and collaboration.  I have reviewed the Fellow's note and chart, and I agree with the management and plan.    

## 2014-02-12 ENCOUNTER — Telehealth: Payer: Self-pay

## 2014-02-12 LAB — KLEIHAUER-BETKE STAIN
# VIALS RHIG: 1
Fetal Cells %: 0 %
Quantitation Fetal Hemoglobin: 0 mL

## 2014-02-12 NOTE — Telephone Encounter (Signed)
Message copied by Louanna RawAMPBELL, Raffaella Edison M on Tue Feb 12, 2014 10:28 AM ------      Message from: Vale HavenBECK, KELI L      Created: Mon Feb 11, 2014  6:32 PM      Regarding: needs rhogam        Hey ladies,                 This pt is coming in for depo in the AM. She also needs RHOGAM as she did not receive it when she left here.  Can you all make sure that gets done?  Thank you so much!!             ~Keli ------

## 2014-02-12 NOTE — Telephone Encounter (Signed)
Pt. Put on schedule for injection of depo and rhogam. Called pt. In an attempt to see when pt. Would like to come. No answer. Left message stating we put her on schedule for 1100, but that she can come anytime between now and 12 or 1 and 430 to receive injection. Also asked that pt. Call clinic to re-schedule for later in the week if she is not going to come today.

## 2014-02-12 NOTE — H&P (Signed)
`````  Attestation of Attending Supervision of Advanced Practitioner: Evaluation and management procedures were performed by the PA/NP/CNM/OB Fellow under my supervision/collaboration. Chart reviewed and agree with management and plan.  Navin Dogan V 02/12/2014 11:04 PM

## 2014-02-12 NOTE — Telephone Encounter (Signed)
Pt. Did not show today. Appointment scheduled for tomorrow 4/8 for injections. Ana called pt. And left message with appointment.

## 2014-02-13 ENCOUNTER — Ambulatory Visit: Payer: Medicaid Other
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
## 2014-02-18 ENCOUNTER — Ambulatory Visit (INDEPENDENT_AMBULATORY_CARE_PROVIDER_SITE_OTHER): Payer: Medicaid Other | Admitting: General Practice

## 2014-02-18 VITALS — BP 129/93 | HR 68 | Temp 97.0°F | Ht 70.0 in | Wt 201.7 lb

## 2014-02-18 DIAGNOSIS — O360990 Maternal care for other rhesus isoimmunization, unspecified trimester, not applicable or unspecified: Secondary | ICD-10-CM

## 2014-02-18 MED ORDER — RHO D IMMUNE GLOBULIN 1500 UNIT/2ML IJ SOLN
300.0000 ug | Freq: Once | INTRAMUSCULAR | Status: AC
  Administered 2014-02-18: 300 ug via INTRAMUSCULAR

## 2014-02-22 LAB — RH IG WORKUP (INCLUDES ABO/RH)
ABO/RH(D): A NEG
ANTIBODY SCREEN: NEGATIVE
FETAL SCREEN: POSITIVE
Gestational Age(Wks): 39
UNIT DIVISION: 0

## 2014-03-25 ENCOUNTER — Ambulatory Visit: Payer: Medicaid Other | Admitting: Obstetrics & Gynecology

## 2014-03-25 ENCOUNTER — Telehealth: Payer: Self-pay

## 2014-03-25 NOTE — Telephone Encounter (Signed)
Patient missed today's PP appointment. Called patient, who stated she was sorry but she couldn't get here. Pt. States she will look at her schedule and call to re-schedule. Informed patient this would be OK.

## 2014-04-05 ENCOUNTER — Encounter: Payer: Self-pay | Admitting: General Practice

## 2014-04-15 ENCOUNTER — Ambulatory Visit: Payer: Medicaid Other | Admitting: Obstetrics & Gynecology

## 2014-05-06 ENCOUNTER — Encounter: Payer: Self-pay | Admitting: General Practice

## 2014-09-09 ENCOUNTER — Encounter (HOSPITAL_COMMUNITY): Payer: Self-pay | Admitting: *Deleted

## 2014-10-27 ENCOUNTER — Emergency Department (HOSPITAL_COMMUNITY): Payer: No Typology Code available for payment source

## 2014-10-27 ENCOUNTER — Encounter (HOSPITAL_COMMUNITY): Payer: Self-pay | Admitting: Emergency Medicine

## 2014-10-27 ENCOUNTER — Emergency Department (HOSPITAL_COMMUNITY)
Admission: EM | Admit: 2014-10-27 | Discharge: 2014-10-27 | Disposition: A | Discharge status: Home / Self Care | Payer: No Typology Code available for payment source | Attending: Emergency Medicine | Admitting: Emergency Medicine

## 2014-10-27 DIAGNOSIS — Y92410 Unspecified street and highway as the place of occurrence of the external cause: Secondary | ICD-10-CM | POA: Diagnosis not present

## 2014-10-27 DIAGNOSIS — S4991XA Unspecified injury of right shoulder and upper arm, initial encounter: Secondary | ICD-10-CM | POA: Diagnosis not present

## 2014-10-27 DIAGNOSIS — Y998 Other external cause status: Secondary | ICD-10-CM | POA: Diagnosis not present

## 2014-10-27 DIAGNOSIS — S299XXA Unspecified injury of thorax, initial encounter: Secondary | ICD-10-CM | POA: Diagnosis not present

## 2014-10-27 DIAGNOSIS — M542 Cervicalgia: Secondary | ICD-10-CM

## 2014-10-27 DIAGNOSIS — S199XXA Unspecified injury of neck, initial encounter: Secondary | ICD-10-CM | POA: Diagnosis present

## 2014-10-27 DIAGNOSIS — Y9389 Activity, other specified: Secondary | ICD-10-CM | POA: Diagnosis not present

## 2014-10-27 MED ORDER — IBUPROFEN 800 MG PO TABS: 800.0000 mg | ORAL_TABLET | Freq: Three times a day (TID) | ORAL | Status: DC

## 2014-10-27 MED ORDER — IBUPROFEN 200 MG PO TABS
400.0000 mg | ORAL_TABLET | Freq: Once | ORAL | Status: AC
  Administered 2014-10-27: 400 mg via ORAL
  Filled 2014-10-27: qty 2

## 2014-10-27 MED ORDER — CYCLOBENZAPRINE HCL 10 MG PO TABS: 10.0000 mg | ORAL_TABLET | Freq: Two times a day (BID) | ORAL | Status: DC | PRN

## 2014-10-27 MED ORDER — HYDROCODONE-ACETAMINOPHEN 5-325 MG PO TABS: 1.0000 | ORAL_TABLET | ORAL | Status: DC | PRN
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
## 2014-11-08 HISTORY — PX: ACHILLES TENDON REPAIR: SUR1153

## 2015-02-12 ENCOUNTER — Emergency Department (HOSPITAL_COMMUNITY): Payer: Medicaid Other

## 2015-02-12 ENCOUNTER — Emergency Department (HOSPITAL_COMMUNITY)
Admission: EM | Admit: 2015-02-12 | Discharge: 2015-02-12 | Disposition: A | Discharge status: Home / Self Care | Payer: Medicaid Other | Attending: Emergency Medicine | Admitting: Emergency Medicine

## 2015-02-12 ENCOUNTER — Encounter (HOSPITAL_COMMUNITY): Payer: Self-pay | Admitting: Emergency Medicine

## 2015-02-12 DIAGNOSIS — X58XXXA Exposure to other specified factors, initial encounter: Secondary | ICD-10-CM | POA: Insufficient documentation

## 2015-02-12 DIAGNOSIS — Y9367 Activity, basketball: Secondary | ICD-10-CM | POA: Insufficient documentation

## 2015-02-12 DIAGNOSIS — Y998 Other external cause status: Secondary | ICD-10-CM | POA: Diagnosis not present

## 2015-02-12 DIAGNOSIS — Z791 Long term (current) use of non-steroidal anti-inflammatories (NSAID): Secondary | ICD-10-CM | POA: Insufficient documentation

## 2015-02-12 DIAGNOSIS — Y92310 Basketball court as the place of occurrence of the external cause: Secondary | ICD-10-CM | POA: Diagnosis not present

## 2015-02-12 DIAGNOSIS — S99912A Unspecified injury of left ankle, initial encounter: Secondary | ICD-10-CM | POA: Diagnosis present

## 2015-02-12 DIAGNOSIS — S86012A Strain of left Achilles tendon, initial encounter: Secondary | ICD-10-CM | POA: Diagnosis not present

## 2015-02-12 MED ORDER — TRAMADOL HCL 50 MG PO TABS
50.0000 mg | ORAL_TABLET | Freq: Once | ORAL | Status: AC
  Administered 2015-02-12: 50 mg via ORAL
  Filled 2015-02-12: qty 1

## 2015-02-12 MED ORDER — TRAMADOL HCL 50 MG PO TABS: 50.0000 mg | ORAL_TABLET | Freq: Four times a day (QID) | ORAL | Status: DC | PRN

## 2015-02-12 MED ORDER — MELOXICAM 7.5 MG PO TABS: 15.0000 mg | ORAL_TABLET | Freq: Every day | ORAL | Status: DC

## 2015-02-12 NOTE — ED Notes (Signed)
Pt. reports left ankle pain with swelling injured this evening while playing basketball .

## 2015-02-12 NOTE — ED Notes (Signed)
Ortho paged. 

## 2015-02-12 NOTE — ED Notes (Signed)
Ortho in room

## 2015-02-12 NOTE — ED Provider Notes (Signed)
CSN: [id number]     Arrival date & time 02/12/15  2031 History   First MD Initiated Contact with Patient 02/12/15 2052     Chief Complaint  Patient presents with  . Ankle Pain    (Consider location/radiation/quality/duration/timing/severity/associated sxs/prior Treatment) HPI Comments: Patient is a semi-pro basketball player  Patient is a 29 y.o. female presenting with ankle pain. The history is provided by the patient. No language interpreter was used.  Ankle Pain Location:  Ankle Time since incident:  2 hours Injury: yes   Mechanism of injury comment:  Patient was running while playing basketball when someone stepped on her foot during a period of forward momentum Ankle location:  L ankle Pain details:    Quality:  Aching   Radiates to:  Does not radiate   Severity:  Mild   Onset quality:  Sudden   Duration:  2 hours   Timing:  Constant   Progression:  Unchanged Chronicity:  New Prior injury to area:  No Relieved by:  Ice and elevation Worsened by:  Bearing weight Associated symptoms: decreased ROM   Associated symptoms: no numbness, no stiffness and no tingling     Past Medical History  Diagnosis Date  . Medical history non-contributory    Past Surgical History  Procedure Laterality Date  . Meniscus repair    . Achilles tendon repair    . No past surgeries     Family History  Problem Relation Age of Onset  . Hypertension Paternal Grandmother   . Diabetes Paternal Grandmother    History  Substance Use Topics  . Smoking status: Never Smoker   . Smokeless tobacco: Never Used  . Alcohol Use: No   OB History    Gravida Para Term Preterm AB TAB SAB Ectopic Multiple Living   4 2 2  0 2 2 0 0 0 2      Review of Systems  Musculoskeletal: Positive for myalgias and arthralgias. Negative for stiffness.  Skin: Negative for pallor.  Neurological: Negative for numbness.  All other systems reviewed and are negative.   Allergies  Review of patient's allergies  indicates no known allergies.  Home Medications   Prior to Admission medications   Medication Sig Start Date End Date Taking? Authorizing Provider  cyclobenzaprine (FLEXERIL) 10 MG tablet Take 1 tablet (10 mg total) by mouth 2 (two) times daily as needed for muscle spasms. 10/27/14   Elpidio AnisShari Upstill, PA-C  HYDROcodone-acetaminophen (NORCO/VICODIN) 5-325 MG per tablet Take 1-2 tablets by mouth every 4 (four) hours as needed. 10/27/14   Elpidio AnisShari Upstill, PA-C  ibuprofen (ADVIL,MOTRIN) 600 MG tablet Take 1 tablet (600 mg total) by mouth every 6 (six) hours as needed (pain scale < 4). 02/11/14   Vale HavenKeli L Beck, MD  ibuprofen (ADVIL,MOTRIN) 800 MG tablet Take 1 tablet (800 mg total) by mouth 3 (three) times daily. 10/27/14   Elpidio AnisShari Upstill, PA-C  meloxicam (MOBIC) 7.5 MG tablet Take 2 tablets (15 mg total) by mouth daily. 02/12/15   Antony MaduraKelly Nasim Habeeb, PA-C  traMADol (ULTRAM) 50 MG tablet Take 1 tablet (50 mg total) by mouth every 6 (six) hours as needed. 02/12/15   Antony MaduraKelly Kristene Liberati, PA-C   BP 127/80 mmHg  Pulse 87  Temp(Src) 98.5 F (36.9 C) (Oral)  Resp 14  SpO2 99%  LMP 01/12/2015   Physical Exam  Constitutional: She is oriented to person, place, and time. She appears well-developed and well-nourished. No distress.  Nontoxic/nonseptic appearing  HENT:  Head: Normocephalic and atraumatic.  Eyes:  Conjunctivae and EOM are normal. No scleral icterus.  Neck: Normal range of motion.  Cardiovascular: Normal rate, regular rhythm and intact distal pulses.   DP and PT pulses 2+ in the LLE  Pulmonary/Chest: Effort normal. No respiratory distress.  Respirations even and unlabored  Musculoskeletal: She exhibits tenderness.       Left ankle: She exhibits decreased range of motion (no AROM with plantar flexion). She exhibits no ecchymosis, no laceration and normal pulse. Achilles tendon exhibits abnormal Thompson's test results.       Left lower leg: She exhibits tenderness. She exhibits no bony tenderness, no edema and no  deformity.       Left foot: Normal.  Neurological: She is alert and oriented to person, place, and time. Coordination normal.  Sensation to light touch intact. Patient able to wiggle all toes. Absent achilles tendon reflex.  Skin: Skin is warm and dry. No rash noted. She is not diaphoretic. No erythema. No pallor.  Psychiatric: She has a normal mood and affect. Her behavior is normal.  Nursing note and vitals reviewed.   ED Course  Procedures (including critical care time) Labs Review Labs Reviewed - No data to display  Imaging Review Dg Ankle Complete Left  02/12/2015   CLINICAL DATA:  Pain in the posterior left ankle after injury during basketball practice tonight.  EXAM: LEFT ANKLE COMPLETE - 3+ VIEW  COMPARISON:  None.  FINDINGS: There is no evidence of fracture, dislocation, or joint effusion. There is no evidence of arthropathy or other focal bone abnormality. Soft tissues are unremarkable.  IMPRESSION: Negative.   Electronically Signed   By: Burman Nieves M.D.   On: 02/12/2015 21:14     EKG Interpretation None      MDM   Final diagnoses:  Ruptured, tendon, Achilles, left, initial encounter    29 year old female presents to the emergency department for further evaluation of left ankle pain. Patient neurovascularly intact with physical exam findings highly suspect for complete left Achilles tendon rupture. X-ray negative for fracture, dislocation, or bony deformity. Dr. Carola Frost consulted, on call for orthopedics, who recommends MRI either emergently or as outpatient with posterior splinting and outpatient f/u. He has requested the patient call his office tomorrow to schedule a f/u appointment. Patient splinted in ED and given crutches. Plan discussed with patient who verbalizes comfort and understand. Patient discharged in good condition. Return precautions provided.   Filed Vitals:   02/12/15 2037  BP: 127/80  Pulse: 87  Temp: 98.5 F (36.9 C)  TempSrc: Oral  Resp: 14   SpO2: 99%     Antony Madura, PA-C 02/12/15 2154  Tilden Fossa, MD 02/12/15 2312

## 2015-02-12 NOTE — Discharge Instructions (Signed)
Recommend icing 3-4 times per day and Mobic as prescribed. Use crutches to prevent from putting weight on your left foot. Leave your splint on at all times. Follow up with Dr. Carola FrostHandy to discuss operative repair. Have an MRI completed on an outpatient basis. Schedule this at - General Radiology Scheduling: [phone]. You may take Tramadol for severe pain. Return to the ED if symptoms worsen.  Complete Achilles Tendon Rupture An Achilles tendon rupture is an injury in which the tough, cord-like band that attaches the lower muscles of your leg to your heel (Achilles tendon) tears (ruptures). In a complete Achilles tendon rupture, you are not able to stand up on the toes of the injured leg. CAUSES A tendon may rupture if it is weakened or weakening (degenerative) and a sudden stress is applied to it. Weakening or degeneration of a tendon may be caused by:  Recurrent injuries, such as those causing Achilles tendinitis.  Damaged tendons.  Aging.  Vascular disease of the tendon. SIGNS AND SYMPTOMS  Feeling as if you were struck violently in the back of the ankle.  Hearing a "pop" and experiencing severe, sudden (acute) pain; however, the absence of pain does not mean there was not a rupture. DIAGNOSIS A physical exam is usually all that is needed to diagnose an Achilles tendon rupture. During the exam, your health care provider will touch the tendon and the structures around it. You may be asked to lie on your stomach or kneel on a chair while your health care provider squeezes your calf muscle. You most likely have a ruptured tendon if your foot does not flex.  Sometimes tests are performed. These may include:  An ultrasound. This allows quick confirmation of the diagnosis.  An X-ray.  An MRI. TREATMENT  A complete Achilles tendon rupture is treated with surgery. You will have a cast while the injury heals (this usually takes 6-10 weeks). Before your surgery, treatment may consist  of:  Ice applied to the area.  Pain-relieving medicines.  Rest.  Crutches.  Keeping the ankle from moving (immobilization), usually with a splint. HOME CARE INSTRUCTIONS   Apply ice to the injured area:   Put ice in a plastic bag.   Place a towel between your skin and the bag.   Leave the ice on for 20 minutes, 2-3 times a day.   Use crutches and move about only as instructed.   Keep the leg elevated above the level of the heart (the center of the chest) at all times when not using the bathroom. Do not dangle the leg over a chair, couch, or bed. When lying down, elevate your leg on a few pillows. Elevation prevents swelling and reduces pain.  Avoid use of the injured area other than gentle range of motion of the toes.  Do not drive a car until your health care provider specifically tells you it is safe to do so.  Take all medicines as directed by your health care provider.  Keep all follow-up visits as directed by your health care provider. SEEK MEDICAL CARE IF:   Your pain and swelling increase, or your pain is not controlled by medicines.   You have new, unexplained symptoms, or your symptoms get worse.  You cannot move your toes or foot.  You develop warmth and swelling in your foot.  You have an unexplained fever. MAKE SURE YOU:   Understand these instructions.  Will watch your condition.  Will get help right away if you are  not doing well or get worse. Document Released: 08/04/2005 Document Revised: 03/11/2014 Document Reviewed: 06/15/2013 [facility] Patient Information 2015 [place], [place]. This information is not intended to replace advice given to you by your health care provider. Make sure you discuss any questions you have with your health care provider.

## 2015-02-12 NOTE — Progress Notes (Signed)
Orthopedic Tech Progress Note Patient Details:  Tracie Valenzuela [birth year] [id number]  Ortho Devices Type of Ortho Device: Ace wrap, Crutches, Post (short leg) splint Ortho Device/Splint Location: LLE Ortho Device/Splint Interventions: Ordered, Application   Jennye MoccasinHughes, [name] Craig 02/12/2015, 10:08 PM

## 2015-02-17 ENCOUNTER — Telehealth (HOSPITAL_COMMUNITY): Payer: Self-pay | Admitting: Radiology

## 2015-02-18 ENCOUNTER — Ambulatory Visit (HOSPITAL_COMMUNITY): Admission: RE | Admit: 2015-02-18 | Payer: Medicaid Other | Source: Ambulatory Visit

## 2015-04-04 ENCOUNTER — Encounter (HOSPITAL_COMMUNITY): Payer: Self-pay | Admitting: *Deleted

## 2015-04-04 ENCOUNTER — Emergency Department (HOSPITAL_COMMUNITY)
Admission: EM | Admit: 2015-04-04 | Discharge: 2015-04-05 | Disposition: A | Discharge status: Home / Self Care | Payer: Medicaid Other | Attending: Emergency Medicine | Admitting: Emergency Medicine

## 2015-04-04 DIAGNOSIS — Z791 Long term (current) use of non-steroidal anti-inflammatories (NSAID): Secondary | ICD-10-CM | POA: Diagnosis not present

## 2015-04-04 DIAGNOSIS — K029 Dental caries, unspecified: Secondary | ICD-10-CM | POA: Diagnosis not present

## 2015-04-04 DIAGNOSIS — R220 Localized swelling, mass and lump, head: Secondary | ICD-10-CM

## 2015-04-04 DIAGNOSIS — L03211 Cellulitis of face: Secondary | ICD-10-CM

## 2015-04-04 NOTE — ED Notes (Signed)
Pt reports left lower facial swelling since this morning. Pt reports taking OTC medication without relief.

## 2015-04-04 NOTE — ED Notes (Signed)
Called pt several times with no answer.

## 2015-04-05 ENCOUNTER — Emergency Department (HOSPITAL_COMMUNITY): Payer: Medicaid Other

## 2015-04-05 MED ORDER — CLINDAMYCIN HCL 300 MG PO CAPS: 300.0000 mg | ORAL_CAPSULE | Freq: Four times a day (QID) | ORAL | Status: DC

## 2015-04-05 MED ORDER — NAPROXEN 500 MG PO TABS: 500.0000 mg | ORAL_TABLET | Freq: Two times a day (BID) | ORAL | Status: DC

## 2015-04-05 MED ORDER — HYDROCODONE-ACETAMINOPHEN 5-325 MG PO TABS
1.0000 | ORAL_TABLET | Freq: Once | ORAL | Status: AC
  Administered 2015-04-05: 1 via ORAL
  Filled 2015-04-05: qty 1

## 2015-04-05 NOTE — ED Notes (Signed)
Pt A&Ox4, ambulatory at d/c with steady gait, NAD 

## 2015-04-05 NOTE — Discharge Instructions (Signed)
Cellulitis Cellulitis is an infection of the skin and the tissue under the skin. The infected area is usually red and tender. Your cellulitis appears to be related to dental caries. HOME CARE   Take your antibiotic medicine as told. Finish the medicine even if you start to feel better.  Put a warm cloth on the area up to 4 times per day.  Only take medicines as told by your doctor.  Keep all doctor visits as told. GET HELP IF:  You see red streaks on the skin coming from the infected area.  Your red area gets bigger or turns a dark color.  You have a puffy (swollen) bump in the infected area.  You have new symptoms.  You have a fever. GET HELP RIGHT AWAY IF:   You feel very sleepy.  You throw up (vomit) or have watery poop (diarrhea).  You feel sick and have muscle aches and pains. MAKE SURE YOU:   Understand these instructions.  Will watch your condition.  Will get help right away if you are not doing well or get worse. Document Released: 04/12/2008 Document Revised: 03/11/2014 Document Reviewed: 01/10/2012 [facility] Patient Information 2015 [employer] [place]. This information is not intended to replace advice given to you by your health care provider. Make sure you discuss any questions you have with your health care provider.

## 2015-04-05 NOTE — ED Provider Notes (Signed)
CSN: [id number]     Arrival date & time 04/04/15  2256 History   First MD Initiated Contact with Patient 04/05/15 0023     Chief Complaint  Patient presents with  . Oral Swelling     (Consider location/radiation/quality/duration/timing/severity/associated sxs/prior Treatment) Patient is a 29 y.o. female presenting with tooth pain.  Dental Pain Location:  Lower Quality:  Pressure-like Severity:  Moderate Progression:  Worsening Context: dental caries   Associated symptoms: facial pain and facial swelling   Associated symptoms: no difficulty swallowing and no trismus     Past Medical History  Diagnosis Date  . Medical history non-contributory    Past Surgical History  Procedure Laterality Date  . Meniscus repair    . Achilles tendon repair    . No past surgeries     Family History  Problem Relation Age of Onset  . Hypertension Paternal Grandmother   . Diabetes Paternal Grandmother    History  Substance Use Topics  . Smoking status: Never Smoker   . Smokeless tobacco: Never Used  . Alcohol Use: No   OB History    Gravida Para Term Preterm AB TAB SAB Ectopic Multiple Living   4 2 2  0 2 2 0 0 0 2     Review of Systems  HENT: Positive for facial swelling.   All other systems reviewed and are negative.     Allergies  Review of patient's allergies indicates no known allergies.  Home Medications   Prior to Admission medications   Medication Sig Start Date End Date Taking? Authorizing Provider  cyclobenzaprine (FLEXERIL) 10 MG tablet Take 1 tablet (10 mg total) by mouth 2 (two) times daily as needed for muscle spasms. 10/27/14   Elpidio AnisShari Upstill, PA-C  HYDROcodone-acetaminophen (NORCO/VICODIN) 5-325 MG per tablet Take 1-2 tablets by mouth every 4 (four) hours as needed. 10/27/14   Elpidio AnisShari Upstill, PA-C  ibuprofen (ADVIL,MOTRIN) 600 MG tablet Take 1 tablet (600 mg total) by mouth every 6 (six) hours as needed (pain scale < 4). 02/11/14   Vale HavenKeli L Beck, MD  ibuprofen  (ADVIL,MOTRIN) 800 MG tablet Take 1 tablet (800 mg total) by mouth 3 (three) times daily. 10/27/14   Elpidio AnisShari Upstill, PA-C  meloxicam (MOBIC) 7.5 MG tablet Take 2 tablets (15 mg total) by mouth daily. 02/12/15   Antony MaduraKelly Humes, PA-C  traMADol (ULTRAM) 50 MG tablet Take 1 tablet (50 mg total) by mouth every 6 (six) hours as needed. 02/12/15   Antony MaduraKelly Humes, PA-C   BP 113/79 mmHg  Pulse 78  Temp(Src) 98.7 F (37.1 C) (Oral)  Resp 16  Ht 5\' 10"  (1.778 m)  Wt 179 lb (81.194 kg)  BMI 25.68 kg/m2  SpO2 98% Physical Exam  Constitutional: She is oriented to person, place, and time. She appears well-developed and well-nourished.  HENT:  Swelling noted to left mandibular area.  Eyes: Pupils are equal, round, and reactive to light.  Neck: Neck supple.  Cardiovascular: Normal rate and regular rhythm.   Pulmonary/Chest: Effort normal and breath sounds normal.  Abdominal: Soft. Bowel sounds are normal.  Musculoskeletal: She exhibits no edema or tenderness.  Lymphadenopathy:    She has no cervical adenopathy.  Neurological: She is alert and oriented to person, place, and time.  Skin: Skin is warm and dry.  Psychiatric: She has a normal mood and affect.  Nursing note and vitals reviewed.   ED Course  Procedures (including critical care time) Labs Review Labs Reviewed - No data to display  Imaging Review  Ct Maxillofacial Wo Cm  04/05/2015   CLINICAL DATA:  Initial evaluation for left facial swelling. No injury.  EXAM: CT MAXILLOFACIAL WITHOUT CONTRAST  TECHNIQUE: Multidetector CT imaging of the maxillofacial structures was performed. Multiplanar CT image reconstructions were also generated. A small metallic BB was placed on the right temple in order to reliably differentiate right from left.  COMPARISON:  None.  FINDINGS: Visualized portions of the brain are unremarkable. Globes within normal limits. Bony orbits intact.  There is left-sided facial swelling with inflammatory soft tissue stranding,  suspicious for cellulitis. Scattered dental caries present within the left mandible, suggesting an odontogenic origin. No loculated collection identified on this non contrast examination. There is asymmetric prominence of several subcentimeter level 1 B nodes on the left, likely reactive in nature. No retropharyngeal fluid collection. Oral cavity itself was was normal limits. Palatine tonsils symmetric and unremarkable.  No maxillofacial fracture. Probable remote fracture of the right lamina papyracea noted.  Paranasal sinuses are clear.  IMPRESSION: Soft tissue swelling with inflammatory stranding within the left face, suspicious for cellulitis. Scattered dental caries within the adjacent left mandible suggest an odontogenic origin. No definite abscess or loculated collection on this noncontrast examination.   Electronically Signed   By: Rise Mu M.D.   On: 04/05/2015 02:12     EKG Interpretation None     Radiology results reviewed and shared with patient. MDM   Final diagnoses:  Facial swelling  Facial cellulitis with likely dental origin.  No identified abscess.  Clindamycin. Anti-inflammatory. Dental follow-up. Return precautions discussed.      Felicie Morn, NP 04/05/15 0234  Cy Blamer, MD 04/05/15 [phone]

## 2017-07-14 ENCOUNTER — Ambulatory Visit (INDEPENDENT_AMBULATORY_CARE_PROVIDER_SITE_OTHER): Payer: Medicaid Other

## 2017-07-14 ENCOUNTER — Encounter: Payer: Self-pay | Admitting: Obstetrics & Gynecology

## 2017-07-14 DIAGNOSIS — Z3201 Encounter for pregnancy test, result positive: Secondary | ICD-10-CM | POA: Diagnosis present

## 2017-07-14 LAB — POCT PREGNANCY, URINE: PREG TEST UR: POSITIVE — AB

## 2017-07-14 NOTE — Progress Notes (Signed)
Chart reviewed for nurse visit. Agree with plan of care.   Pincus Largehelps, Matthe Sloane Y, DO 07/14/2017 11:37 AM

## 2017-07-14 NOTE — Progress Notes (Signed)
Patient presented to the office for UPT. UPT positive. Patient is 13w 3d. Advised patient to start prenatal care and prenatal vitamins.

## 2017-08-01 ENCOUNTER — Ambulatory Visit (INDEPENDENT_AMBULATORY_CARE_PROVIDER_SITE_OTHER): Payer: Medicaid Other | Admitting: Certified Nurse Midwife

## 2017-08-01 ENCOUNTER — Other Ambulatory Visit (HOSPITAL_COMMUNITY)
Admission: RE | Admit: 2017-08-01 | Discharge: 2017-08-01 | Disposition: A | Discharge status: Home / Self Care | Payer: Medicaid Other | Source: Ambulatory Visit | Attending: Certified Nurse Midwife | Admitting: Certified Nurse Midwife

## 2017-08-01 ENCOUNTER — Encounter: Payer: Self-pay | Admitting: Certified Nurse Midwife

## 2017-08-01 ENCOUNTER — Ambulatory Visit: Payer: Self-pay

## 2017-08-01 VITALS — BP 129/74 | HR 79 | Wt 202.1 lb

## 2017-08-01 DIAGNOSIS — O0931 Supervision of pregnancy with insufficient antenatal care, first trimester: Secondary | ICD-10-CM | POA: Diagnosis not present

## 2017-08-01 DIAGNOSIS — B9689 Other specified bacterial agents as the cause of diseases classified elsewhere: Secondary | ICD-10-CM | POA: Diagnosis not present

## 2017-08-01 DIAGNOSIS — O3680X0 Pregnancy with inconclusive fetal viability, not applicable or unspecified: Secondary | ICD-10-CM

## 2017-08-01 DIAGNOSIS — Z3481 Encounter for supervision of other normal pregnancy, first trimester: Secondary | ICD-10-CM

## 2017-08-01 DIAGNOSIS — O0932 Supervision of pregnancy with insufficient antenatal care, second trimester: Secondary | ICD-10-CM

## 2017-08-01 DIAGNOSIS — N760 Acute vaginitis: Secondary | ICD-10-CM | POA: Diagnosis not present

## 2017-08-01 DIAGNOSIS — Z3687 Encounter for antenatal screening for uncertain dates: Secondary | ICD-10-CM | POA: Diagnosis not present

## 2017-08-01 DIAGNOSIS — O98811 Other maternal infectious and parasitic diseases complicating pregnancy, first trimester: Secondary | ICD-10-CM | POA: Diagnosis not present

## 2017-08-01 DIAGNOSIS — O219 Vomiting of pregnancy, unspecified: Secondary | ICD-10-CM | POA: Diagnosis not present

## 2017-08-01 DIAGNOSIS — O0930 Supervision of pregnancy with insufficient antenatal care, unspecified trimester: Secondary | ICD-10-CM | POA: Insufficient documentation

## 2017-08-01 DIAGNOSIS — Z3480 Encounter for supervision of other normal pregnancy, unspecified trimester: Secondary | ICD-10-CM

## 2017-08-01 LAB — POCT URINALYSIS DIP (DEVICE)
Bilirubin Urine: NEGATIVE
Glucose, UA: NEGATIVE mg/dL
Ketones, ur: NEGATIVE mg/dL
Nitrite: NEGATIVE
PH: 7 (ref 5.0–8.0)
Protein, ur: NEGATIVE mg/dL
Specific Gravity, Urine: 1.02 (ref 1.005–1.030)
Urobilinogen, UA: 1 mg/dL (ref 0.0–1.0)

## 2017-08-01 MED ORDER — PRENATAL VITAMINS 0.8 MG PO TABS
1.0000 | ORAL_TABLET | Freq: Every day | ORAL | 12 refills | Status: DC
Start: 2017-08-01 — End: 2018-02-03

## 2017-08-01 MED ORDER — PROMETHAZINE HCL 25 MG PO TABS: 25.0000 mg | ORAL_TABLET | Freq: Four times a day (QID) | ORAL | 1 refills | Status: DC | PRN

## 2017-08-01 NOTE — Progress Notes (Signed)
Pt informed that the ultrasound is considered a limited OB ultrasound and is not intended to be a complete ultrasound exam.  Patient also informed that the ultrasound is not being completed with the intent of assessing for fetal or placental anomalies or any pelvic abnormalities.  Explained that the purpose of today's ultrasound is to assess for viability, estimated EGA.  Patient acknowledges the purpose of the exam and the limitations of the study.

## 2017-08-01 NOTE — Progress Notes (Signed)
Morning sickness Headaches  Declined flu

## 2017-08-01 NOTE — Progress Notes (Signed)
New ob packet given medicard home form completed

## 2017-08-01 NOTE — Progress Notes (Signed)
   PRENATAL VISIT NOTE  Subjective:  Tracie Valenzuela is a 31 y.o. [id number] at Unknown being seen today for initial prenatal care.  She is currently monitored for the following issues for this low-risk pregnancy and has Rh negative status during pregnancy; Encounter for supervision of other normal pregnancy, unspecified trimester; Late prenatal care affecting pregnancy; and Nausea/vomiting in pregnancy on her problem list.  Patient reports headache and vomiting.   .  .  Movement: Present. Denies leaking of fluid.   The following portions of the patient's history were reviewed and updated as appropriate: allergies, current medications, past family history, past medical history, past social history, past surgical history and problem list. Problem list updated.  Objective:   Vitals:   08/01/17 0837  BP: 129/74  Pulse: 79  Weight: 202 lb 1.6 oz (91.7 kg)    Fetal Status:     Movement: Present     General:  Alert, oriented and cooperative. Patient is in no acute distress.  Skin: Skin is warm and dry. No rash noted.   Cardiovascular: Normal heart rate noted  Respiratory: Normal respiratory effort, no problems with respiration noted  Abdomen: Soft, gravid, appropriate for gestational age.        Pelvic: cervix visualized, no masses, mild white discharge        Extremities: Normal range of motion.     Mental Status:  Normal mood and affect. Normal behavior. Normal judgment and thought content.   Assessment and Plan:  Pregnancy: [id number] at 17w by 17w ultrasound  1. Encounter to determine fetal viability of pregnancy, single or unspecified fetus -labs today, pap smear performed - US OB Limited; Future  Preterm labor symptoms and general obstetric precautions including but not limited to vaginal bleeding, contractions, leaking of fluid and fetal movement were reviewed in detail with the patient. Please refer to After Visit Summary for other counseling recommendations.  Return in about 4  weeks (around 08/29/2017).   Rolm Bookbinder, DO

## 2017-08-02 ENCOUNTER — Encounter: Payer: Self-pay | Admitting: *Deleted

## 2017-08-02 LAB — OBSTETRIC PANEL, INCLUDING HIV
ANTIBODY SCREEN: NEGATIVE
BASOS: 0 %
Basophils Absolute: 0 10*3/uL (ref 0.0–0.2)
EOS (ABSOLUTE): 0.1 10*3/uL (ref 0.0–0.4)
Eos: 1 %
HEMATOCRIT: 34.3 % (ref 34.0–46.6)
HIV SCREEN 4TH GENERATION: NONREACTIVE
Hemoglobin: 11.1 g/dL (ref 11.1–15.9)
Hepatitis B Surface Ag: NEGATIVE
IMMATURE GRANS (ABS): 0.1 10*3/uL (ref 0.0–0.1)
Immature Granulocytes: 1 %
LYMPHS: 15 %
Lymphocytes Absolute: 1.8 10*3/uL (ref 0.7–3.1)
MCH: 27.3 pg (ref 26.6–33.0)
MCHC: 32.4 g/dL (ref 31.5–35.7)
MCV: 85 fL (ref 79–97)
MONOS ABS: 0.9 10*3/uL (ref 0.1–0.9)
Monocytes: 7 %
Neutrophils Absolute: 9 10*3/uL — ABNORMAL HIGH (ref 1.4–7.0)
Neutrophils: 76 %
Platelets: 348 10*3/uL (ref 150–379)
RBC: 4.06 x10E6/uL (ref 3.77–5.28)
RDW: 13.1 % (ref 12.3–15.4)
RH TYPE: NEGATIVE
RPR Ser Ql: NONREACTIVE
Rubella Antibodies, IGG: 1.14 index (ref >0.99)
WBC: 11.8 10*3/uL — ABNORMAL HIGH (ref 3.4–10.8)

## 2017-08-02 LAB — HEMOGLOBINOPATHY EVALUATION
Ferritin: 124 ng/mL (ref 15–150)
HGB SOLUBILITY: NEGATIVE
HGB VARIANT: 0 %
Hgb A2 Quant: 2.4 % (ref 1.8–3.2)
Hgb A: 97.6 % (ref 96.4–98.8)
Hgb C: 0 %
Hgb F Quant: 0 % (ref 0.0–2.0)
Hgb S: 0 %

## 2017-08-02 LAB — CYTOLOGY - PAP
BACTERIAL VAGINITIS: POSITIVE — AB
CHLAMYDIA, DNA PROBE: NEGATIVE
Candida vaginitis: NEGATIVE
Diagnosis: NEGATIVE
Neisseria Gonorrhea: NEGATIVE
TRICH (WINDOWPATH): NEGATIVE

## 2017-08-03 ENCOUNTER — Telehealth: Payer: Self-pay

## 2017-08-03 LAB — CULTURE, OB URINE

## 2017-08-03 LAB — URINE CULTURE, OB REFLEX

## 2017-08-03 NOTE — Telephone Encounter (Signed)
Called patient no answer or voicemail to leave a message. Patient has +BV. Needs Flagyl 500 mg bid x7 days.

## 2017-08-05 ENCOUNTER — Encounter: Payer: Self-pay | Admitting: Obstetrics and Gynecology

## 2017-08-05 ENCOUNTER — Other Ambulatory Visit: Payer: Self-pay

## 2017-08-05 MED ORDER — METRONIDAZOLE 500 MG PO TABS: 500.0000 mg | ORAL_TABLET | Freq: Two times a day (BID) | ORAL | 0 refills | Status: DC

## 2017-08-05 NOTE — Telephone Encounter (Signed)
Patient has been informed of lab results and need to start flagyl to clear up her BV. Rx called in to the pharmacy.

## 2017-08-15 ENCOUNTER — Other Ambulatory Visit: Payer: Self-pay | Admitting: Certified Nurse Midwife

## 2017-08-15 ENCOUNTER — Ambulatory Visit (HOSPITAL_COMMUNITY)
Admission: RE | Admit: 2017-08-15 | Discharge: 2017-08-15 | Disposition: A | Discharge status: Home / Self Care | Payer: Medicaid Other | Source: Ambulatory Visit | Attending: Certified Nurse Midwife | Admitting: Certified Nurse Midwife

## 2017-08-15 DIAGNOSIS — Z3481 Encounter for supervision of other normal pregnancy, first trimester: Secondary | ICD-10-CM

## 2017-08-15 DIAGNOSIS — Z3A19 19 weeks gestation of pregnancy: Secondary | ICD-10-CM | POA: Diagnosis not present

## 2017-08-15 DIAGNOSIS — Z369 Encounter for antenatal screening, unspecified: Secondary | ICD-10-CM

## 2017-08-15 DIAGNOSIS — Z363 Encounter for antenatal screening for malformations: Secondary | ICD-10-CM | POA: Insufficient documentation

## 2017-08-29 ENCOUNTER — Ambulatory Visit (INDEPENDENT_AMBULATORY_CARE_PROVIDER_SITE_OTHER): Payer: Medicaid Other | Admitting: Advanced Practice Midwife

## 2017-08-29 DIAGNOSIS — Z3480 Encounter for supervision of other normal pregnancy, unspecified trimester: Secondary | ICD-10-CM

## 2017-08-29 DIAGNOSIS — Z3482 Encounter for supervision of other normal pregnancy, second trimester: Secondary | ICD-10-CM

## 2017-08-29 NOTE — Patient Instructions (Signed)
Second Trimester of Pregnancy The second trimester is from week 14 through week 27 (months 4 through 6). The second trimester is often a time when you feel your best. Your body has adjusted to being pregnant, and you begin to feel better physically. Usually, morning sickness has lessened or quit completely, you may have more energy, and you may have an increase in appetite. The second trimester is also a time when the fetus is growing rapidly. At the end of the sixth month, the fetus is about 9 inches long and weighs about 1 pounds. You will likely begin to feel the baby move (quickening) between 16 and 20 weeks of pregnancy. Body changes during your second trimester Your body continues to go through many changes during your second trimester. The changes vary from woman to woman.  Your weight will continue to increase. You will notice your lower abdomen bulging out.  You may begin to get stretch marks on your hips, abdomen, and breasts.  You may develop headaches that can be relieved by medicines. The medicines should be approved by your health care provider.  You may urinate more often because the fetus is pressing on your bladder.  You may develop or continue to have heartburn as a result of your pregnancy.  You may develop constipation because certain hormones are causing the muscles that push waste through your intestines to slow down.  You may develop hemorrhoids or swollen, bulging veins (varicose veins).  You may have back pain. This is caused by: ? Weight gain. ? Pregnancy hormones that are relaxing the joints in your pelvis. ? A shift in weight and the muscles that support your balance.  Your breasts will continue to grow and they will continue to become tender.  Your gums may bleed and may be sensitive to brushing and flossing.  Dark spots or blotches (chloasma, mask of pregnancy) may develop on your face. This will likely fade after the baby is born.  A dark line from your  belly button to the pubic area (linea nigra) may appear. This will likely fade after the baby is born.  You may have changes in your hair. These can include thickening of your hair, rapid growth, and changes in texture. Some women also have hair loss during or after pregnancy, or hair that feels dry or thin. Your hair will most likely return to normal after your baby is born.  What to expect at prenatal visits During a routine prenatal visit:  You will be weighed to make sure you and the fetus are growing normally.  Your blood pressure will be taken.  Your abdomen will be measured to track your baby's growth.  The fetal heartbeat will be listened to.  Any test results from the previous visit will be discussed.  Your health care provider may ask you:  How you are feeling.  If you are feeling the baby move.  If you have had any abnormal symptoms, such as leaking fluid, bleeding, severe headaches, or abdominal cramping.  If you are using any tobacco products, including cigarettes, chewing tobacco, and electronic cigarettes.  If you have any questions.  Other tests that may be performed during your second trimester include:  Blood tests that check for: ? Low iron levels (anemia). ? High blood sugar that affects pregnant women (gestational diabetes) between 24 and 28 weeks. ? Rh antibodies. This is to check for a protein on red blood cells (Rh factor).  Urine tests to check for infections, diabetes, or   protein in the urine.  An ultrasound to confirm the proper growth and development of the baby.  An amniocentesis to check for possible genetic problems.  Fetal screens for spina bifida and Down syndrome.  HIV (human immunodeficiency virus) testing. Routine prenatal testing includes screening for HIV, unless you choose not to have this test.  Follow these instructions at home: Medicines  Follow your health care provider's instructions regarding medicine use. Specific  medicines may be either safe or unsafe to take during pregnancy.  Take a prenatal vitamin that contains at least 600 micrograms (mcg) of folic acid.  If you develop constipation, try taking a stool softener if your health care provider approves. Eating and drinking  Eat a balanced diet that includes fresh fruits and vegetables, whole grains, good sources of protein such as meat, eggs, or tofu, and low-fat dairy. Your health care provider will help you determine the amount of weight gain that is right for you.  Avoid raw meat and uncooked cheese. These carry germs that can cause birth defects in the baby.  If you have low calcium intake from food, talk to your health care provider about whether you should take a daily calcium supplement.  Limit foods that are high in fat and processed sugars, such as fried and sweet foods.  To prevent constipation: ? Drink enough fluid to keep your urine clear or pale yellow. ? Eat foods that are high in fiber, such as fresh fruits and vegetables, whole grains, and beans. Activity  Exercise only as directed by your health care provider. Most women can continue their usual exercise routine during pregnancy. Try to exercise for 30 minutes at least 5 days a week. Stop exercising if you experience uterine contractions.  Avoid heavy lifting, wear low heel shoes, and practice good posture.  A sexual relationship may be continued unless your health care provider directs you otherwise. Relieving pain and discomfort  Wear a good support bra to prevent discomfort from breast tenderness.  Take warm sitz baths to soothe any pain or discomfort caused by hemorrhoids. Use hemorrhoid cream if your health care provider approves.  Rest with your legs elevated if you have leg cramps or low back pain.  If you develop varicose veins, wear support hose. Elevate your feet for 15 minutes, 3-4 times a day. Limit salt in your diet. Prenatal Care  Write down your questions.  Take them to your prenatal visits.  Keep all your prenatal visits as told by your health care provider. This is important. Safety  Wear your seat belt at all times when driving.  Make a list of emergency phone numbers, including numbers for family, friends, the hospital, and police and fire departments. General instructions  Ask your health care provider for a referral to a local prenatal education class. Begin classes no later than the beginning of month 6 of your pregnancy.  Ask for help if you have counseling or nutritional needs during pregnancy. Your health care provider can offer advice or refer you to specialists for help with various needs.  Do not use hot tubs, steam rooms, or saunas.  Do not douche or use tampons or scented sanitary pads.  Do not cross your legs for long periods of time.  Avoid cat litter boxes and soil used by cats. These carry germs that can cause birth defects in the baby and possibly loss of the fetus by miscarriage or stillbirth.  Avoid all smoking, herbs, alcohol, and unprescribed drugs. Chemicals in these products can   affect the formation and growth of the baby.  Do not use any products that contain nicotine or tobacco, such as cigarettes and e-cigarettes. If you need help quitting, ask your health care provider.  Visit your dentist if you have not gone yet during your pregnancy. Use a soft toothbrush to brush your teeth and be gentle when you floss. Contact a health care provider if:  You have dizziness.  You have mild pelvic cramps, pelvic pressure, or nagging pain in the abdominal area.  You have persistent nausea, vomiting, or diarrhea.  You have a bad smelling vaginal discharge.  You have pain when you urinate. Get help right away if:  You have a fever.  You are leaking fluid from your vagina.  You have spotting or bleeding from your vagina.  You have severe abdominal cramping or pain.  You have rapid weight gain or weight  loss.  You have shortness of breath with chest pain.  You notice sudden or extreme swelling of your face, hands, ankles, feet, or legs.  You have not felt your baby move in over an hour.  You have severe headaches that do not go away when you take medicine.  You have vision changes. Summary  The second trimester is from week 14 through week 27 (months 4 through 6). It is also a time when the fetus is growing rapidly.  Your body goes through many changes during pregnancy. The changes vary from woman to woman.  Avoid all smoking, herbs, alcohol, and unprescribed drugs. These chemicals affect the formation and growth your baby.  Do not use any tobacco products, such as cigarettes, chewing tobacco, and e-cigarettes. If you need help quitting, ask your health care provider.  Contact your health care provider if you have any questions. Keep all prenatal visits as told by your health care provider. This is important. This information is not intended to replace advice given to you by your health care provider. Make sure you discuss any questions you have with your health care provider. Document Released: 10/19/2001 Document Revised: 04/01/2016 Document Reviewed: 12/26/2012 Elsevier Interactive Patient Education  2017 [employer].   CIRCUMCISION  Circumcision is considered an elective/non-medically necessary procedure. There are many reasons parents decide to have their sons circumsized. During the first year of life circumcised males have a reduced risk of urinary tract infections but after this year the rates between circumcised males and uncircumcised males are the same.  It is safe to have your son circumcised outside of the hospital and the places above perform them regularly.    Places to have your son circumcised:    [facility] [phone] $480 by 4 wks  [facility] [phone] $244  by 4 wks  [facility] [phone] $175 by 2 wks  [facility] [phone] $250 by 7 days [facility] [phone] $150 by 4 wks  These prices sometimes change but are roughly what you can expect to pay. Please call and confirm pricing.    AREA PEDIATRIC/FAMILY PRACTICE PHYSICIANS  [facility] [address] [place], [place]  [address] Phone - [phone]   Fax - [phone]  [facility] [address] [place], [place] [address] Phone - [phone]   Fax - [phone]  [facility] [address] [place], [place]  [address] Phone - [phone]   Fax - [phone]  [facility] [address] [place], [place]  [address] Phone - [phone]   Fax - [phone]  [facility] [address] [place], [place]  [address] Phone - [phone]   Fax - [phone]  [facility] [address] [place], [place]  [address] Phone - [phone]   Fax - [phone]  [facility] [address] [place], [place]  [address] Phone - [phone]   Fax - [phone]  [facility] AT [facility] [address] [place], [address] [place], [place]  [address] Phone - [phone]   Fax - [phone]  [facility] AT [facility] [address] [place], [place]  [address] Phone - [phone]   Fax - [phone] [facility] AT [place] [address] [place], [place]  [address] Phone - [phone]   Fax - [phone]  [facility] [address] [place], [place]  [address] Phone - [phone]   Fax - [phone]  [facility] [address], [address] [place], [place]  [address] Phone - [phone]   Fax - [phone]  [facility] [address] [place], [place]  [address] Phone - [phone]   Fax - [phone]  [facility] [address] [place], [place]  [address] Phone - [phone]  [facility] [address] [place], [place]  [address] Phone - [phone]   Fax - [phone]  [facility] [address] [place], [place]  [address] Phone - [phone]   Fax - [phone]  [facility] [address] [place], [place]  [address] Phone - [phone]   Fax - [phone]  [facility] [address] [place], [place]  [address] Phone - [phone]   Fax - [phone]   [facility] [address] [place], [place]  [address] Phone - [phone]   Fax - [phone]  [facility] [address] [place], [place]  [address] Phone - [phone]   Fax - [phone]  [facility] [address] [place], [place]  [address] Phone - [phone]   Fax - [phone]  [facility] [address] [place], [place]  [address] Phone - [phone]   Fax - [phone]  [facility] Alita Chyle [address] [place], [place]  [address] Phone - [phone]   Fax - [phone] Gerarda Fraction [address] [place], [place]  [address] Phone - [phone]   Fax - [phone]  [facility] [address] [place], [place]  [address] Phone - [phone]   Fax - [phone]  [facility] - [place] [address] [place], [place]  [address] Phone - [phone]   Fax - [phone]  [facility] [place] Wyvonne Lenz MD [address] [place] [address] Phone [phone]  Fax [phone]

## 2017-08-29 NOTE — Progress Notes (Signed)
   PRENATAL VISIT NOTE  Subjective:  Tracie Valenzuela is a 31 y.o. [id number] at 5891w5d being seen today for ongoing prenatal care.  She is currently monitored for the following issues for this low-risk pregnancy and has Rh negative status during pregnancy; Encounter for supervision of other normal pregnancy, unspecified trimester; Late prenatal care affecting pregnancy; and Nausea/vomiting in pregnancy on her problem list.  Patient reports no complaints.  Contractions: Not present. Vag. Bleeding: None.  Movement: Present. Denies leaking of fluid.   The following portions of the patient's history were reviewed and updated as appropriate: allergies, current medications, past family history, past medical history, past social history, past surgical history and problem list. Problem list updated.  Objective:   Vitals:   08/29/17 0753  BP: 126/72  Pulse: 74  Weight: 205 lb 1.6 oz (93 kg)    Fetal Status: Fetal Heart Rate (bpm): 155 Fundal Height: 22 cm Movement: Present     General:  Alert, oriented and cooperative. Patient is in no acute distress.  Skin: Skin is warm and dry. No rash noted.   Cardiovascular: Normal heart rate noted  Respiratory: Normal respiratory effort, no problems with respiration noted  Abdomen: Soft, gravid, appropriate for gestational age.  Pain/Pressure: Absent     Pelvic: Cervical exam deferred        Extremities: Normal range of motion.  Edema: Trace  Mental Status:  Normal mood and affect. Normal behavior. Normal judgment and thought content.   Assessment and Plan:  Pregnancy: [id number] at 291w5d  1. Encounter for supervision of other normal pregnancy, unspecified trimester -Circumcision information reviewed -Pediatrician list given   Preterm labor symptoms and general obstetric precautions including but not limited to vaginal bleeding, contractions, leaking of fluid and fetal movement were reviewed in detail with the patient. Please refer to After Visit Summary  for other counseling recommendations.  Return in about 4 weeks (around 09/26/2017) for ROB.   Rolm BookbinderCaroline M Sadarius Norman, CNM 08/29/17 8:16 AM

## 2017-09-26 ENCOUNTER — Ambulatory Visit (INDEPENDENT_AMBULATORY_CARE_PROVIDER_SITE_OTHER): Payer: Medicaid Other | Admitting: Obstetrics & Gynecology

## 2017-09-26 VITALS — BP 120/70 | HR 82 | Wt 212.0 lb

## 2017-09-26 DIAGNOSIS — Z3480 Encounter for supervision of other normal pregnancy, unspecified trimester: Secondary | ICD-10-CM

## 2017-09-26 NOTE — Progress Notes (Signed)
   PRENATAL VISIT NOTE  Subjective:  Tracie Valenzuela is a 31 y.o. [id number] at 10463w5d being seen today for ongoing prenatal care.  She is currently monitored for the following issues for this low-risk pregnancy and has Rh negative status during pregnancy; Encounter for supervision of other normal pregnancy, unspecified trimester; Late prenatal care affecting pregnancy; and Nausea/vomiting in pregnancy on their problem list.  Patient reports no complaints.  Contractions: Not present. Vag. Bleeding: None.  Movement: Present. Denies leaking of fluid.   The following portions of the patient's history were reviewed and updated as appropriate: allergies, current medications, past family history, past medical history, past social history, past surgical history and problem list. Problem list updated.  Objective:   Vitals:   09/26/17 0806  BP: 120/70  Pulse: 82  Weight: 212 lb (96.2 kg)    Fetal Status: Fetal Heart Rate (bpm): 152   Movement: Present     General:  Alert, oriented and cooperative. Patient is in no acute distress.  Skin: Skin is warm and dry. No rash noted.   Cardiovascular: Normal heart rate noted  Respiratory: Normal respiratory effort, no problems with respiration noted  Abdomen: Soft, gravid, appropriate for gestational age.  Pain/Pressure: Present     Pelvic: Cervical exam deferred        Extremities: Normal range of motion.  Edema: None  Mental Status:  Normal mood and affect. Normal behavior. Normal judgment and thought content.   Assessment and Plan:  Pregnancy: [id number] at 2363w5d  1. Encounter for supervision of other normal pregnancy, unspecified trimester   Preterm labor symptoms and general obstetric precautions including but not limited to vaginal bleeding, contractions, leaking of fluid and fetal movement were reviewed in detail with the patient. Please refer to After Visit Summary for other counseling recommendations.  No Follow-up on file.   Allie BossierMyra C Jurgen Groeneveld,  MD

## 2017-09-26 NOTE — Progress Notes (Signed)
Pt stated having discomfort and the appetite is decreasing. Delined flu shot

## 2017-10-17 ENCOUNTER — Encounter: Payer: Medicaid Other | Admitting: Advanced Practice Midwife

## 2017-10-21 ENCOUNTER — Ambulatory Visit (INDEPENDENT_AMBULATORY_CARE_PROVIDER_SITE_OTHER): Payer: Medicaid Other | Admitting: Medical

## 2017-10-21 ENCOUNTER — Encounter: Payer: Self-pay | Admitting: Medical

## 2017-10-21 VITALS — BP 112/71 | HR 88 | Wt 212.1 lb

## 2017-10-21 DIAGNOSIS — Z23 Encounter for immunization: Secondary | ICD-10-CM

## 2017-10-21 DIAGNOSIS — Z6791 Unspecified blood type, Rh negative: Secondary | ICD-10-CM | POA: Diagnosis present

## 2017-10-21 DIAGNOSIS — O09893 Supervision of other high risk pregnancies, third trimester: Secondary | ICD-10-CM

## 2017-10-21 DIAGNOSIS — Z3480 Encounter for supervision of other normal pregnancy, unspecified trimester: Secondary | ICD-10-CM

## 2017-10-21 DIAGNOSIS — O26893 Other specified pregnancy related conditions, third trimester: Secondary | ICD-10-CM

## 2017-10-21 MED ORDER — RHO D IMMUNE GLOBULIN 1500 UNIT/2ML IJ SOSY
300.0000 ug | PREFILLED_SYRINGE | Freq: Once | INTRAMUSCULAR | Status: AC
  Administered 2017-10-21: 300 ug via INTRAMUSCULAR

## 2017-10-21 NOTE — Progress Notes (Signed)
   PRENATAL VISIT NOTE  Subjective:  Tracie Valenzuela is a 31 y.o. [id number] at 8552w2d being seen today for ongoing prenatal care.  She is currently monitored for the following issues for this low-risk pregnancy and has Rh negative status during pregnancy; Encounter for supervision of other normal pregnancy, unspecified trimester; Late prenatal care affecting pregnancy; and Nausea/vomiting in pregnancy on their problem list.  Patient reports nausea and vomiting. States she takes Phenergan every "1-3 days" for this. Contractions: Not present. Vag. Bleeding: None.  Movement: Present. Denies leaking of fluid.   The following portions of the patient's history were reviewed and updated as appropriate: allergies, current medications, past family history, past medical history, past social history, past surgical history and problem list. Problem list updated.  Objective:   Vitals:   10/21/17 0806  BP: 112/71  Pulse: 88  Weight: 212 lb 1.6 oz (96.2 kg)    Fetal Status: Fetal Heart Rate (bpm): 148   Movement: Present     General:  Alert, oriented and cooperative. Patient is in no acute distress.  Skin: Skin is warm and dry. No rash noted.   Cardiovascular: Normal heart rate noted  Respiratory: Normal respiratory effort, no problems with respiration noted  Abdomen: Soft, gravid, appropriate for gestational age.  Pain/Pressure: Present     Pelvic: Cervical exam deferred        Extremities: Normal range of motion.  Edema: None  Mental Status:  Normal mood and affect. Normal behavior. Normal judgment and thought content.   Assessment and Plan:  Pregnancy: [id number] at 2352w2d  1. Supervision of other normal pregnancy, antepartum - Continue Phenergan for nausea - Unable to complete Glucose Tolerance, 2 Hours w/1 Hour due to patient vomiting. Will do 1 hr screening on Monday 12/17.  - RPR - HIV antibody - CBC - Tdap vaccine greater than or equal to 7yo IM given today  2. Rh negative status during  pregnancy in third trimester - rho (d) immune globulin (RHIG/RHOPHYLAC) injection 300 mcg given today  Preterm labor symptoms and general obstetric precautions including but not limited to vaginal bleeding, contractions, leaking of fluid and fetal movement were reviewed in detail with the patient. Please refer to After Visit Summary for other counseling recommendations.  Return in about 2 weeks (around 11/04/2017) for LOB.   Felisa BonierBianca Cheril Slattery, Student-PA

## 2017-10-21 NOTE — Patient Instructions (Signed)
Fetal Movement Counts °Patient Name: ________________________________________________ Patient Due Date: ____________________ °What is a fetal movement count? °A fetal movement count is the number of times that you feel your baby move during a certain amount of time. This may also be called a fetal kick count. A fetal movement count is recommended for every pregnant woman. You may be asked to start counting fetal movements as early as week 28 of your pregnancy. °Pay attention to when your baby is most active. You may notice your baby's sleep and wake cycles. You may also notice things that make your baby move more. You should do a fetal movement count: °· When your baby is normally most active. °· At the same time each day. ° °A good time to count movements is while you are resting, after having something to eat and drink. °How do I count fetal movements? °1. Find a quiet, comfortable area. Sit, or lie down on your side. °2. Write down the date, the start time and stop time, and the number of movements that you felt between those two times. Take this information with you to your health care visits. °3. For 2 hours, count kicks, flutters, swishes, rolls, and jabs. You should feel at least 10 movements during 2 hours. °4. You may stop counting after you have felt 10 movements. °5. If you do not feel 10 movements in 2 hours, have something to eat and drink. Then, keep resting and counting for 1 hour. If you feel at least 4 movements during that hour, you may stop counting. °Contact a health care provider if: °· You feel fewer than 4 movements in 2 hours. °· Your baby is not moving like he or she usually does. °Date: ____________ Start time: ____________ Stop time: ____________ Movements: ____________ °Date: ____________ Start time: ____________ Stop time: ____________ Movements: ____________ °Date: ____________ Start time: ____________ Stop time: ____________ Movements: ____________ °Date: ____________ Start time:  ____________ Stop time: ____________ Movements: ____________ °Date: ____________ Start time: ____________ Stop time: ____________ Movements: ____________ °Date: ____________ Start time: ____________ Stop time: ____________ Movements: ____________ °Date: ____________ Start time: ____________ Stop time: ____________ Movements: ____________ °Date: ____________ Start time: ____________ Stop time: ____________ Movements: ____________ °Date: ____________ Start time: ____________ Stop time: ____________ Movements: ____________ °This information is not intended to replace advice given to you by your health care provider. Make sure you discuss any questions you have with your health care provider. °Document Released: 11/24/2006 Document Revised: 06/23/2016 Document Reviewed: 12/04/2015 °Elsevier Interactive Patient Education © 2018 [employer] °Braxton Hicks Contractions °Contractions of the uterus can occur throughout pregnancy, but they are not always a sign that you are in labor. You may have practice contractions called Braxton Hicks contractions. These false labor contractions are sometimes confused with true labor. °What are Braxton Hicks contractions? °Braxton Hicks contractions are tightening movements that occur in the muscles of the uterus before labor. Unlike true labor contractions, these contractions do not result in opening (dilation) and thinning of the cervix. Toward the end of pregnancy (32-34 weeks), Braxton Hicks contractions can happen more often and may become stronger. These contractions are sometimes difficult to tell apart from true labor because they can be very uncomfortable. You should not feel embarrassed if you go to the hospital with false labor. °Sometimes, the only way to tell if you are in true labor is for your health care provider to look for changes in the cervix. The health care provider will do a physical exam and may monitor your contractions. If   you are not in true labor, the exam  should show that your cervix is not dilating and your water has not broken. °If there are no prenatal problems or other health problems associated with your pregnancy, it is completely safe for you to be sent home with false labor. You may continue to have Braxton Hicks contractions until you go into true labor. °How can I tell the difference between true labor and false labor? °· Differences °? False labor °? Contractions last 30-70 seconds.: Contractions are usually shorter and not as strong as true labor contractions. °? Contractions become very regular.: Contractions are usually irregular. °? Discomfort is usually felt in the top of the uterus, and it spreads to the lower abdomen and low back.: Contractions are often felt in the front of the lower abdomen and in the groin. °? Contractions do not go away with walking.: Contractions may go away when you walk around or change positions while lying down. °? Contractions usually become more intense and increase in frequency.: Contractions get weaker and are shorter-lasting as time goes on. °? The cervix dilates and gets thinner.: The cervix usually does not dilate or become thin. °Follow these instructions at home: °· Take over-the-counter and prescription medicines only as told by your health care provider. °· Keep up with your usual exercises and follow other instructions from your health care provider. °· Eat and drink lightly if you think you are going into labor. °· If Braxton Hicks contractions are making you uncomfortable: °? Change your position from lying down or resting to walking, or change from walking to resting. °? Sit and rest in a tub of warm water. °? Drink enough fluid to keep your urine clear or pale yellow. Dehydration may cause these contractions. °? Do slow and deep breathing several times an hour. °· Keep all follow-up prenatal visits as told by your health care provider. This is important. °Contact a health care provider if: °· You have a  fever. °· You have continuous pain in your abdomen. °Get help right away if: °· Your contractions become stronger, more regular, and closer together. °· You have fluid leaking or gushing from your vagina. °· You pass blood-tinged mucus (bloody show). °· You have bleeding from your vagina. °· You have low back pain that you never had before. °· You feel your baby’s head pushing down and causing pelvic pressure. °· Your baby is not moving inside you as much as it used to. °Summary °· Contractions that occur before labor are called Braxton Hicks contractions, false labor, or practice contractions. °· Braxton Hicks contractions are usually shorter, weaker, farther apart, and less regular than true labor contractions. True labor contractions usually become progressively stronger and regular and they become more frequent. °· Manage discomfort from Braxton Hicks contractions by changing position, resting in a warm bath, drinking plenty of water, or practicing deep breathing. °This information is not intended to replace advice given to you by your health care provider. Make sure you discuss any questions you have with your health care provider. °Document Released: 10/25/2005 Document Revised: 09/13/2016 Document Reviewed: 09/13/2016 °Elsevier Interactive Patient Education © 2017 [employer] Inc[employer] ° °

## 2017-10-21 NOTE — Progress Notes (Signed)
   PRENATAL VISIT NOTE  Subjective:  Hulen SkainsFantasia Utsey is a 31 y.o. [id number] at 8112w2d being seen today for ongoing prenatal care.  She is currently monitored for the following issues for this low-risk pregnancy and has Rh negative status during pregnancy; Encounter for supervision of other normal pregnancy, unspecified trimester; Late prenatal care affecting pregnancy; and Nausea/vomiting in pregnancy on their problem list.  Patient reports nausea and vomiting.  Contractions: Not present. Vag. Bleeding: None.  Movement: Present. Denies leaking of fluid.   The following portions of the patient's history were reviewed and updated as appropriate: allergies, current medications, past family history, past medical history, past social history, past surgical history and problem list. Problem list updated.  Objective:   Vitals:   10/21/17 0806  BP: 112/71  Pulse: 88  Weight: 212 lb 1.6 oz (96.2 kg)    Fetal Status: Fetal Heart Rate (bpm): 148 Fundal Height: 29 cm Movement: Present     General:  Alert, oriented and cooperative. Patient is in no acute distress.  Skin: Skin is warm and dry. No rash noted.   Cardiovascular: Normal heart rate noted  Respiratory: Normal respiratory effort, no problems with respiration noted  Abdomen: Soft, gravid, appropriate for gestational age.  Pain/Pressure: Present     Pelvic: Cervical exam deferred        Extremities: Normal range of motion.  Edema: None  Mental Status:  Normal mood and affect. Normal behavior. Normal judgment and thought content.   Assessment and Plan:  Pregnancy: [id number] at 1112w2d  1. Supervision of other normal pregnancy, antepartum - Glucose Tolerance, 2 Hours w/1 Hour - patient vomited prior to completing test - will return Monday for 1 hour GTT - RPR - HIV antibody - CBC - Tdap vaccine greater than or equal to 7yo IM  2. Rh negative status during pregnancy in third trimester - rho (d) immune globulin (RHIG/RHOPHYLAC) injection 300  mcg  Preterm labor symptoms and general obstetric precautions including but not limited to vaginal bleeding, contractions, leaking of fluid and fetal movement were reviewed in detail with the patient. Please refer to After Visit Summary for other counseling recommendations.  Return in about 2 weeks (around 11/04/2017) for LOB.   Vonzella NippleJulie Fillmore Bynum, PA-C

## 2017-10-22 LAB — RPR: RPR: NONREACTIVE

## 2017-10-22 LAB — CBC
HEMOGLOBIN: 9.6 g/dL — AB (ref 11.1–15.9)
Hematocrit: 28.9 % — ABNORMAL LOW (ref 34.0–46.6)
MCH: 28 pg (ref 26.6–33.0)
MCHC: 33.2 g/dL (ref 31.5–35.7)
MCV: 84 fL (ref 79–97)
Platelets: 296 10*3/uL (ref 150–379)
RBC: 3.43 x10E6/uL — AB (ref 3.77–5.28)
RDW: 13.9 % (ref 12.3–15.4)
WBC: 10.4 10*3/uL (ref 3.4–10.8)

## 2017-10-22 LAB — HIV ANTIBODY (ROUTINE TESTING W REFLEX): HIV Screen 4th Generation wRfx: NONREACTIVE

## 2017-10-23 ENCOUNTER — Other Ambulatory Visit: Payer: Self-pay | Admitting: Obstetrics & Gynecology

## 2017-10-23 ENCOUNTER — Other Ambulatory Visit: Payer: Self-pay | Admitting: Medical

## 2017-10-23 DIAGNOSIS — O99013 Anemia complicating pregnancy, third trimester: Secondary | ICD-10-CM

## 2017-10-23 MED ORDER — FERROUS SULFATE 325 (65 FE) MG PO TABS: 325.0000 mg | ORAL_TABLET | Freq: Every day | ORAL | 2 refills | Status: DC

## 2017-10-24 ENCOUNTER — Other Ambulatory Visit: Payer: Medicaid Other

## 2017-10-24 DIAGNOSIS — O24419 Gestational diabetes mellitus in pregnancy, unspecified control: Secondary | ICD-10-CM

## 2017-10-24 NOTE — Addendum Note (Signed)
Addended by: Cheree DittoGRAHAM, Jalaila Caradonna A on: 10/24/2017 08:14 AM   Modules accepted: Orders

## 2017-10-25 LAB — HIV ANTIBODY (ROUTINE TESTING W REFLEX): HIV Screen 4th Generation wRfx: NONREACTIVE

## 2017-10-25 LAB — GLUCOSE TOLERANCE, 2 HOURS W/ 1HR

## 2017-10-25 LAB — CBC
HEMATOCRIT: 29.7 % — AB (ref 34.0–46.6)
HEMOGLOBIN: 9.6 g/dL — AB (ref 11.1–15.9)
MCH: 27.5 pg (ref 26.6–33.0)
MCHC: 32.3 g/dL (ref 31.5–35.7)
MCV: 85 fL (ref 79–97)
Platelets: 311 10*3/uL (ref 150–379)
RBC: 3.49 x10E6/uL — AB (ref 3.77–5.28)
RDW: 13.8 % (ref 12.3–15.4)
WBC: 11.2 10*3/uL — ABNORMAL HIGH (ref 3.4–10.8)

## 2017-10-25 LAB — RPR: RPR: NONREACTIVE

## 2017-10-25 LAB — GLUCOSE TOLERANCE, 1 HOUR: Glucose, 1Hr PP: 111 mg/dL (ref 65–199)

## 2017-11-08 DIAGNOSIS — O99013 Anemia complicating pregnancy, third trimester: Secondary | ICD-10-CM | POA: Insufficient documentation

## 2017-11-08 NOTE — L&D Delivery Note (Signed)
Delivery Note At 11:50 PM a viable female was delivered via Vaginal, Spontaneous (Presentation: LOA, en caul;  ).  APGAR: 9/9; weight  .pending  After 1 minute, the cord was clamped and cut. 40 units of pitocin diluted in 1000cc LR was infused rapidly IV.  The placenta separated spontaneously and delivered via CCT and maternal pushing effort.  It was inspected and appears to be intact with a 3 VC.  Anesthesia:   Episiotomy: None Lacerations: None Suture Repair:  Est. Blood Loss (mL): 200  Mom to postpartum.  Baby to Couplet care / Skin to Skin.  Tracie Valenzuela 12/23/2017, 12:06 AM     Please schedule this patient for PP visit in: 4 weeks Low risk pregnancy complicated by:  Delivery mode:  SVD Anticipated Birth Control:  OCPs PP Procedures needed:   Schedule Integrated BH visit: no Provider: Any provider

## 2017-11-10 ENCOUNTER — Encounter: Payer: Medicaid Other | Admitting: Student

## 2017-11-10 ENCOUNTER — Encounter: Payer: Self-pay | Admitting: Family Medicine

## 2017-11-15 ENCOUNTER — Ambulatory Visit (INDEPENDENT_AMBULATORY_CARE_PROVIDER_SITE_OTHER): Payer: Medicaid Other | Admitting: Obstetrics and Gynecology

## 2017-11-15 DIAGNOSIS — Z3480 Encounter for supervision of other normal pregnancy, unspecified trimester: Secondary | ICD-10-CM

## 2017-11-15 DIAGNOSIS — O99013 Anemia complicating pregnancy, third trimester: Secondary | ICD-10-CM

## 2017-11-15 IMAGING — US US MFM OB COMP +14 WKS
1 series · 14 of 28 positions shown · non-contrast
Comparison: none

[Series 1: us mfm ob comp +14 wks · 14 of 61 slices shown]
[im 3/61]
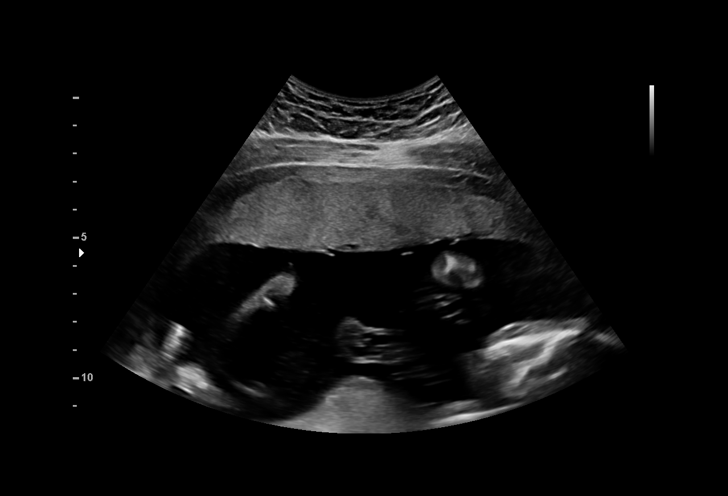
[im 7/61]
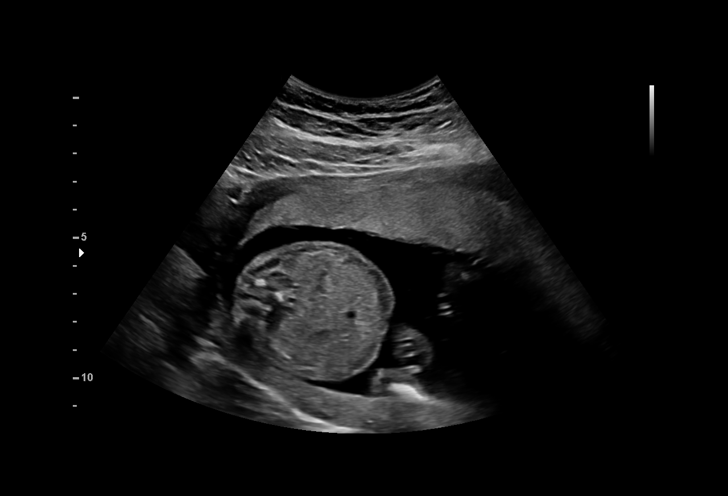
[im 12/61]
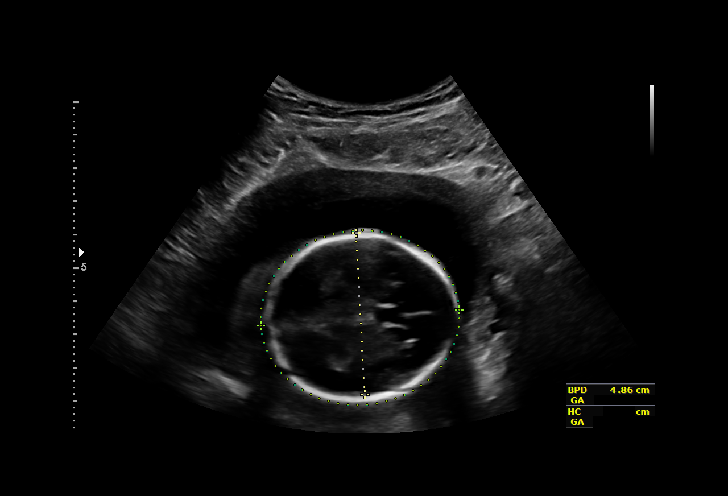
[im 16/61]
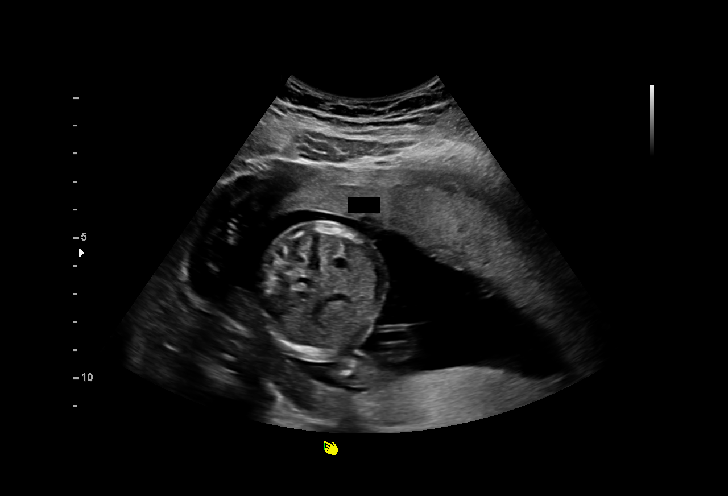
[im 21/61]
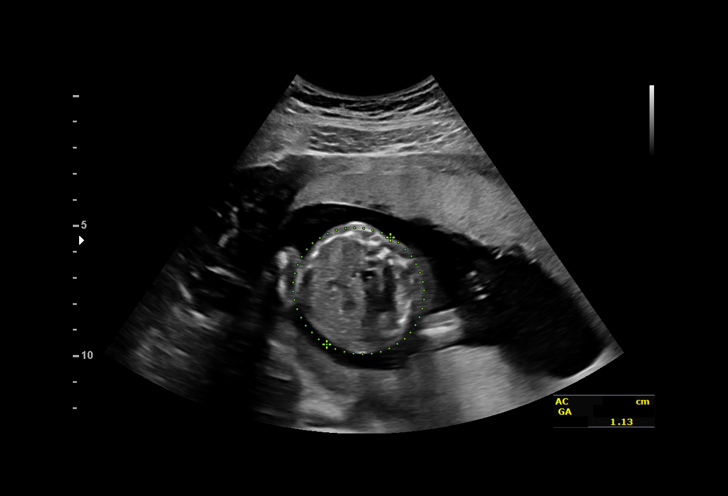
[im 25/61]
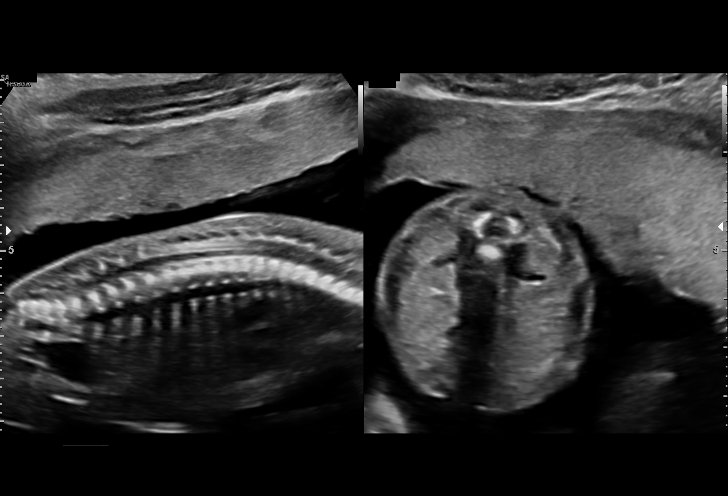
[im 29/61]
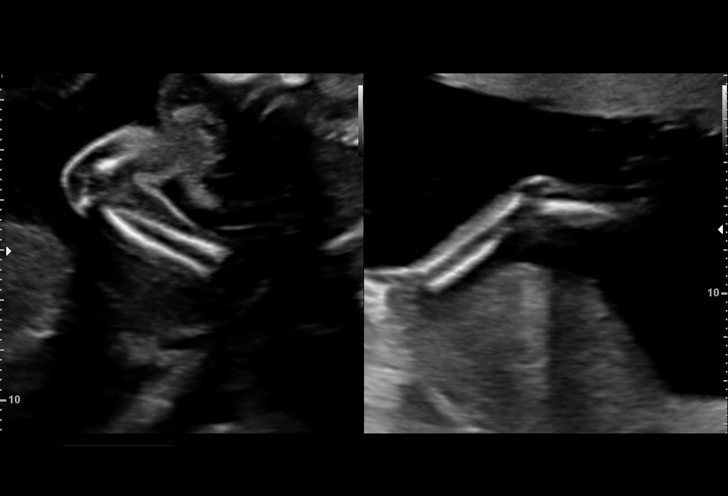
[im 34/61]
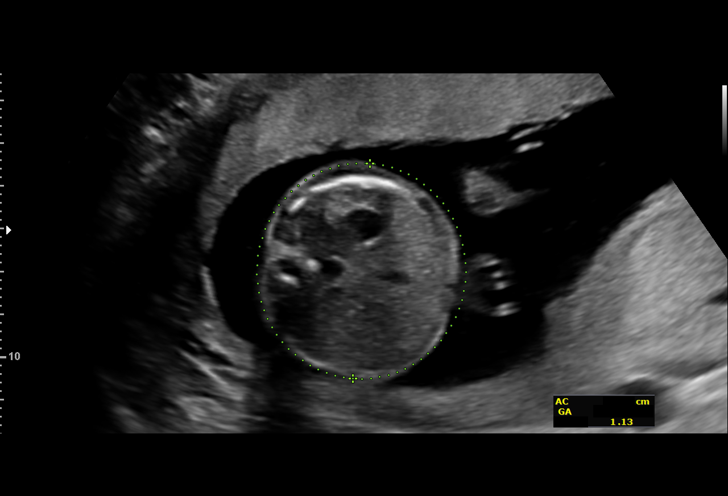
[im 38/61]
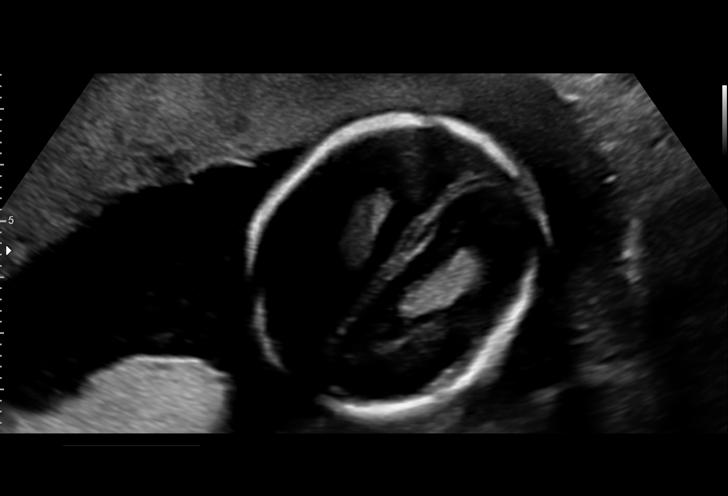
[im 43/61]
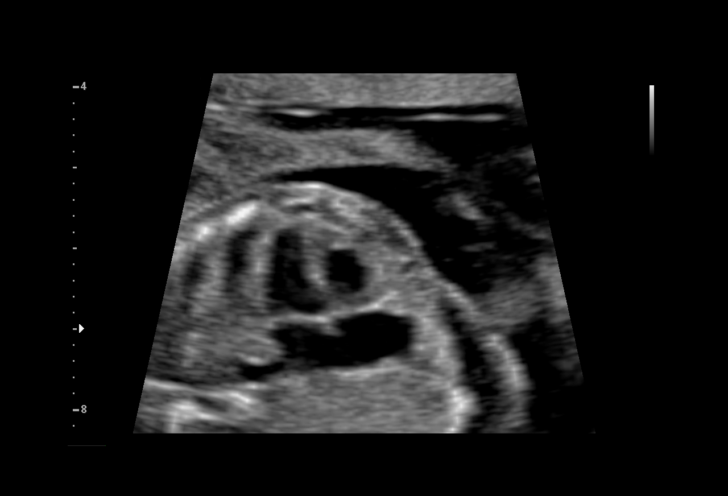
[im 47/61]
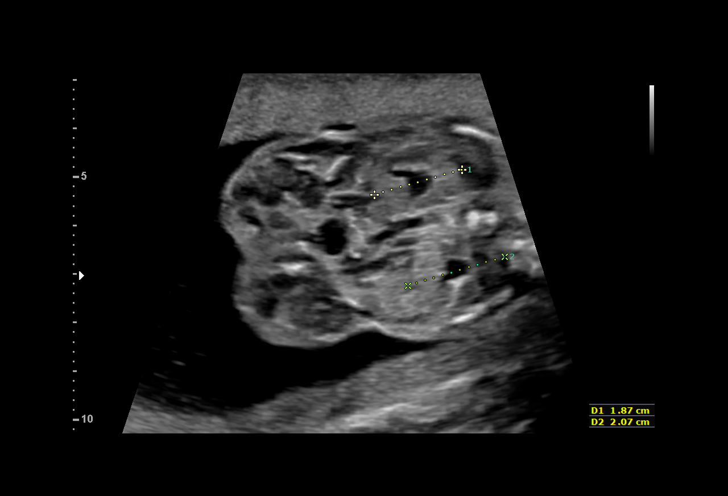
[im 52/61]
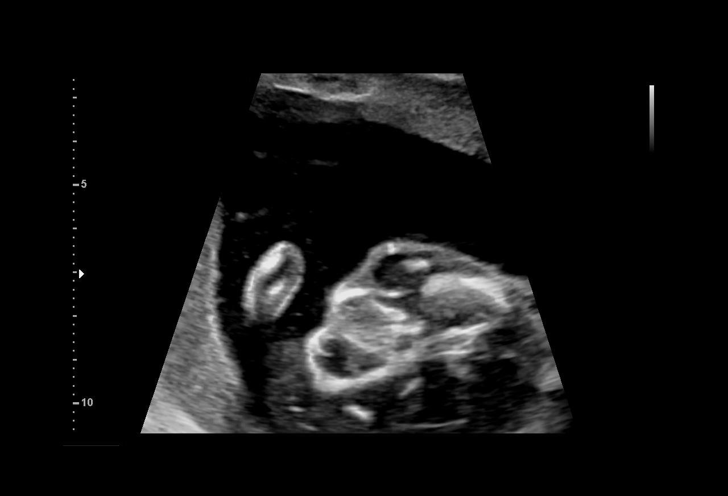
[im 56/61]
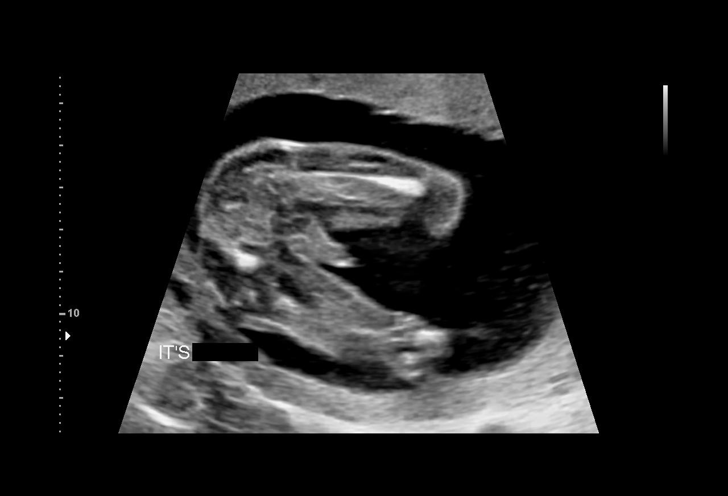
[im 61/61]
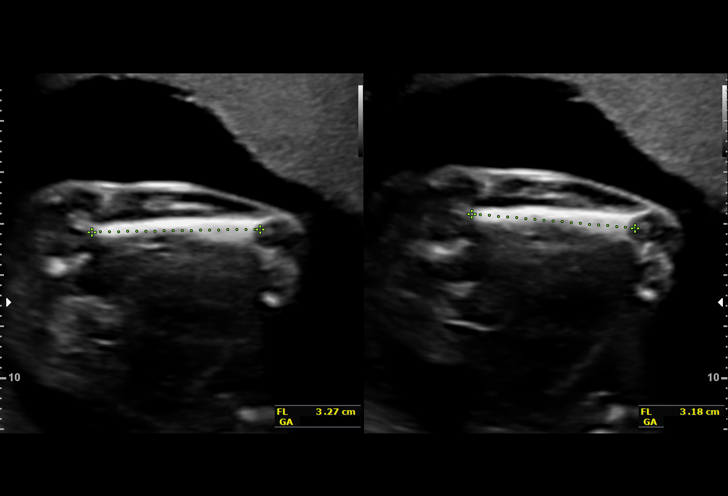

[14 of 28 positions shown; findings below may reference images not displayed]

[facility]

1  RUDI JUMPER          [id number]      [id number]     [id number]
Indications

19 weeks gestation of pregnancy
Encounter for antenatal screening for
malformations
OB History

Blood Type:   A-       Height:  5'10"  Weight (lb):  179       BMI:
lb
Gravidity:    5         Term:   2        Prem:   0        SAB:   2
Living:       2
Fetal Evaluation

Num Of Fetuses:     1
Fetal Heart         154
Rate(bpm):
Cardiac Activity:   Observed
Presentation:       Cephalic
Placenta:           Anterior, above cervical os
P. Cord Insertion:  Visualized

Amniotic Fluid
AFI FV:      Subjectively within normal limits

Largest Pocket(cm)
6.13
Biometry

BPD:      48.7  mm     G. Age:  20w 5d         87  %    CI:        76.97   %    70 - 86
FL/HC:      19.1   %    16.8 -
HC:      175.8  mm     G. Age:  20w 1d         60  %    HC/AC:      1.12        1.09 -
AC:      157.2  mm     G. Age:  20w 6d         80  %    FL/BPD:     69.0   %
FL:       33.6  mm     G. Age:  20w 4d         72  %    FL/AC:      21.4   %    20 - 24
CER:      23.5  mm     G. Age:  21w 5d       > 95  %
NFT:       5.1  mm

CM:        5.7  mm

Est. FW:     368  gm    0 lb 13 oz      61  %
Gestational Age

LMP:           18w 2d        Date:  04/09/17                 EDD:   01/14/18
U/S Today:     20w 4d                                        EDD:   12/29/17
Best:          19w 5d     Det. By:  U/S  (08/01/17)          EDD:   01/04/18
Anatomy

Cranium:               Appears normal         Aortic Arch:            Appears normal
Cavum:                 Appears normal         Ductal Arch:            Appears normal
Ventricles:            Appears normal         Diaphragm:              Appears normal
Choroid Plexus:        Appears normal         Stomach:                Appears normal, left
sided
Cerebellum:            Appears normal         Abdomen:                Appears normal
Posterior Fossa:       Appears normal         Abdominal Wall:         Appears nml (cord
insert, abd wall)
Nuchal Fold:           Appears normal         Cord Vessels:           Appears normal (3
vessel cord)
Face:                  Appears normal         Kidneys:                Appear normal
(orbits and profile)
Lips:                  Appears normal         Bladder:                Appears normal
Thoracic:              Appears normal         Spine:                  Appears normal
Heart:                 Appears normal         Upper Extremities:      Appears normal
(4CH, axis, and situs
RVOT:                  Appears normal         Lower Extremities:      Appears normal
LVOT:                  Appears normal

Other:  Male gender.
Cervix Uterus Adnexa

Cervix
Length:            3.8  cm.
Normal appearance by transabdominal scan.

Uterus
No abnormality visualized.

Left Ovary
Not visualized. No adnexal mass visualized.

Right Ovary
Not visualized. No adnexal mass visualized.

Cul De Sac:   No free fluid seen.

Adnexa:       No abnormality visualized.
Impression

SIUP at 19+5 weeks
Normal detailed fetal anatomy
Markers of aneuploidy: none
Normal amniotic fluid volume
Measurements consistent with prior US
Recommendations

Follow-up as clinically indicated

## 2017-11-15 MED ORDER — ONDANSETRON 8 MG PO TBDP
8.0000 mg | ORAL_TABLET | Freq: Three times a day (TID) | ORAL | 0 refills | Status: DC | PRN
Start: 2017-11-15 — End: 2017-11-23

## 2017-11-15 NOTE — Progress Notes (Signed)
   PRENATAL VISIT NOTE  Subjective:  Tracie Valenzuela is a 32 y.o. [id number] at 6645w6d being seen today for ongoing prenatal care.  She is currently monitored for the following issues for this low-risk pregnancy and has Rh negative status during pregnancy; Encounter for supervision of other normal pregnancy, unspecified trimester; Late prenatal care affecting pregnancy; Nausea/vomiting in pregnancy; and Anemia affecting pregnancy in third trimester on their problem list.  Patient reports no complaints.  Contractions: Irregular. Vag. Bleeding: None.  Movement: Present. Denies leaking of fluid.   The following portions of the patient's history were reviewed and updated as appropriate: allergies, current medications, past family history, past medical history, past social history, past surgical history and problem list. Problem list updated.  Objective:   Vitals:   11/15/17 0929  BP: 110/66  Pulse: 93  Weight: 209 lb 1.6 oz (94.8 kg)    Fetal Status: Fetal Heart Rate (bpm): 136 Fundal Height: 32 cm Movement: Present     General:  Alert, oriented and cooperative. Patient is in no acute distress.  Skin: Skin is warm and dry. No rash noted.   Cardiovascular: Normal heart rate noted  Respiratory: Normal respiratory effort, no problems with respiration noted  Abdomen: Soft, gravid, appropriate for gestational age.  Pain/Pressure: Present     Pelvic: Cervical exam deferred        Extremities: Normal range of motion.  Edema: None  Mental Status:  Normal mood and affect. Normal behavior. Normal judgment and thought content.   Assessment and Plan:  Pregnancy: [id number] at 4845w6d  1. Encounter for supervision of other normal pregnancy, unspecified trimester  Patient was not fasting today for her 2 hour GTT. Patient concerned because she vomited the last time she attempted the 2 hour. We discussed other options. Patient had a normal 1 hour GTT (111), no history of diabetes with previous pregnancies.  Patient instructed to return within a week to complete 1 hour GTT (50 gram) with an A1C. Discussed this option with Dr. Alysia PennaErvin. Zofran 8 mg ODT also called into pharmacy for the patient to pick up prior to her testing. Patient voices understanding and appreciation as this has caused her a lot of worry and stress.   2. Anemia affecting pregnancy in third trimester  Increase oral iron to BID, discussed constipation if occurs. Patient may need IV iron prior to delivery if no improvement.    There are no diagnoses linked to this encounter. Preterm labor symptoms and general obstetric precautions including but not limited to vaginal bleeding, contractions, leaking of fluid and fetal movement were reviewed in detail with the patient. Please refer to After Visit Summary for other counseling recommendations.  Return in about 1 week (around 11/22/2017) for Within 1 week for 2 hour GTT.   Venia CarbonJennifer Arlington Sigmund, NP

## 2017-11-23 ENCOUNTER — Ambulatory Visit (INDEPENDENT_AMBULATORY_CARE_PROVIDER_SITE_OTHER): Payer: Medicaid Other

## 2017-11-23 VITALS — BP 129/67 | HR 86 | Wt 213.2 lb

## 2017-11-23 DIAGNOSIS — R102 Pelvic and perineal pain: Secondary | ICD-10-CM

## 2017-11-23 DIAGNOSIS — Z3483 Encounter for supervision of other normal pregnancy, third trimester: Secondary | ICD-10-CM

## 2017-11-23 DIAGNOSIS — O26899 Other specified pregnancy related conditions, unspecified trimester: Secondary | ICD-10-CM

## 2017-11-23 DIAGNOSIS — O99013 Anemia complicating pregnancy, third trimester: Secondary | ICD-10-CM

## 2017-11-23 DIAGNOSIS — O26893 Other specified pregnancy related conditions, third trimester: Secondary | ICD-10-CM

## 2017-11-23 NOTE — Progress Notes (Signed)
   PRENATAL VISIT NOTE  Subjective:  Tracie Valenzuela is a 32 y.o. [id number] at 5572w0d being seen today for ongoing prenatal care.  She is currently monitored for the following issues for this low-risk pregnancy and has Rh negative status during pregnancy; Encounter for supervision of other normal pregnancy, unspecified trimester; Late prenatal care affecting pregnancy; Nausea/vomiting in pregnancy; and Anemia affecting pregnancy in third trimester on their problem list.  Patient reports no bleeding, no leaking and reports constant pelvic and thigh pain that has not been relieved with tylenol or heating pads.  Contractions: Irritability. Vag. Bleeding: None.  Movement: Present. Denies leaking of fluid.   The following portions of the patient's history were reviewed and updated as appropriate: allergies, current medications, past family history, past medical history, past social history, past surgical history and problem list. Problem list updated.  Objective:   Vitals:   11/23/17 0914  BP: 129/67  Pulse: 86  Weight: 213 lb 3.2 oz (96.7 kg)    Fetal Status: Fetal Heart Rate (bpm): 140 Fundal Height: 33 cm Movement: Present     General:  Alert, oriented and cooperative. Patient is in no acute distress.  Skin: Skin is warm and dry. No rash noted.   Cardiovascular: Normal heart rate noted  Respiratory: Normal respiratory effort, no problems with respiration noted  Abdomen: Soft, gravid, appropriate for gestational age.  Pain/Pressure: Present     Pelvic: Cervical exam deferred        Extremities: Normal range of motion.  Edema: None  Mental Status:  Normal mood and affect. Normal behavior. Normal judgment and thought content.   Assessment and Plan:  Pregnancy: [id number] at 6572w0d  1. Encounter for supervision of other normal pregnancy in third trimester The patient reports no leaking or vaginal bleeding but did report pain in her pelvic region and thighs consistent with round ligament pain.   Recommendation for pregnancy belt was made. Otherwise no complaints at this time.  Patient took zofran prior to 1 hour GTT and was able to tolerate the solution.A1C was also collected.  Patient will follow up in 2 weeks for 36 week visit.  Reviewed next visit with patient including screening for GBS, Chlamydia , and Gonorrhea.  2. Anemia affecting pregnancy in third trimester Did not discuss at this visit.  Last CBC was in December and showed a Hem of 9.6 and patient was started on oral iron BID. Repeat CBC at 36 week.  Patient may need IV iron prior to delivery if no improvement.   Term labor symptoms and general obstetric precautions including but not limited to vaginal bleeding, contractions, leaking of fluid and fetal movement were reviewed in detail with the patient. Please refer to After Visit Summary for other counseling recommendations.  Return in about 2 weeks (around 12/07/2017) for Return OB visit.   Ames Coupeharles A McLendon, Medical Student    I confirm that I have verified the information documented in the medical student's note and that I have also personally reperformed the physical exam and all medical decision making activities.  Rolm BookbinderCaroline M Neill, CNM 11/23/17  10:06 AM

## 2017-11-23 NOTE — Patient Instructions (Addendum)
Fetal Movement Counts Patient Name: ________________________________________________ Patient Due Date: ____________________ What is a fetal movement count? A fetal movement count is the number of times that you feel your baby move during a certain amount of time. This may also be called a fetal kick count. A fetal movement count is recommended for every pregnant woman. You may be asked to start counting fetal movements as early as week 28 of your pregnancy. Pay attention to when your baby is most active. You may notice your baby's sleep and wake cycles. You may also notice things that make your baby move more. You should do a fetal movement count:  When your baby is normally most active.  At the same time each day.  A good time to count movements is while you are resting, after having something to eat and drink. How do I count fetal movements? 1. Find a quiet, comfortable area. Sit, or lie down on your side. 2. Write down the date, the start time and stop time, and the number of movements that you felt between those two times. Take this information with you to your health care visits. 3. For 2 hours, count kicks, flutters, swishes, rolls, and jabs. You should feel at least 10 movements during 2 hours. 4. You may stop counting after you have felt 10 movements. 5. If you do not feel 10 movements in 2 hours, have something to eat and drink. Then, keep resting and counting for 1 hour. If you feel at least 4 movements during that hour, you may stop counting. Contact a health care provider if:  You feel fewer than 4 movements in 2 hours.  Your baby is not moving like he or she usually does. Date: ____________ Start time: ____________ Stop time: ____________ Movements: ____________ Date: ____________ Start time: ____________ Stop time: ____________ Movements: ____________ Date: ____________ Start time: ____________ Stop time: ____________ Movements: ____________ Date: ____________ Start time:  ____________ Stop time: ____________ Movements: ____________ Date: ____________ Start time: ____________ Stop time: ____________ Movements: ____________ Date: ____________ Start time: ____________ Stop time: ____________ Movements: ____________ Date: ____________ Start time: ____________ Stop time: ____________ Movements: ____________ Date: ____________ Start time: ____________ Stop time: ____________ Movements: ____________ Date: ____________ Start time: ____________ Stop time: ____________ Movements: ____________ This information is not intended to replace advice given to you by your health care provider. Make sure you discuss any questions you have with your health care provider. Document Released: 11/24/2006 Document Revised: 06/23/2016 Document Reviewed: 12/04/2015 Elsevier Interactive Patient Education  2018 [employer]. [employer] of the uterus can occur throughout pregnancy, but they are not always a sign that you are in labor. You may have practice contractions called Braxton Hicks contractions. These false labor contractions are sometimes confused with true labor. What are [place] contractions? Braxton Hicks contractions are tightening movements that occur in the muscles of the uterus before labor. Unlike true labor contractions, these contractions do not result in opening (dilation) and thinning of the cervix. Toward the end of pregnancy (32-34 weeks), Braxton Hicks contractions can happen more often and may become stronger. These contractions are sometimes difficult to tell apart from true labor because they can be very uncomfortable. You should not feel embarrassed if you go to the hospital with false labor. Sometimes, the only way to tell if you are in true labor is for your health care provider to look for changes in the cervix. The health care provider will do a physical exam and may monitor your contractions. If  you are not in true labor, the exam  should show that your cervix is not dilating and your water has not broken. If there are other health problems associated with your pregnancy, it is completely safe for you to be sent home with false labor. You may continue to have Braxton Hicks contractions until you go into true labor. How to tell the difference between true labor and false labor True labor  Contractions last 30-70 seconds.  Contractions become very regular.  Discomfort is usually felt in the top of the uterus, and it spreads to the lower abdomen and low back.  Contractions do not go away with walking.  Contractions usually become more intense and increase in frequency.  The cervix dilates and gets thinner. False labor  Contractions are usually shorter and not as strong as true labor contractions.  Contractions are usually irregular.  Contractions are often felt in the front of the lower abdomen and in the groin.  Contractions may go away when you walk around or change positions while lying down.  Contractions get weaker and are shorter-lasting as time goes on.  The cervix usually does not dilate or become thin. Follow these instructions at home:  Take over-the-counter and prescription medicines only as told by your health care provider.  Keep up with your usual exercises and follow other instructions from your health care provider.  Eat and drink lightly if you think you are going into labor.  If Braxton Hicks contractions are making you uncomfortable: ? Change your position from lying down or resting to walking, or change from walking to resting. ? Sit and rest in a tub of warm water. ? Drink enough fluid to keep your urine pale yellow. Dehydration may cause these contractions. ? Do slow and deep breathing several times an hour.  Keep all follow-up prenatal visits as told by your health care provider. This is important. Contact a health care provider if:  You have a fever.  You have continuous pain  in your abdomen. Get help right away if:  Your contractions become stronger, more regular, and closer together.  You have fluid leaking or gushing from your vagina.  You pass blood-tinged mucus (bloody show).  You have bleeding from your vagina.  You have low back pain that you never had before.  You feel your baby's head pushing down and causing pelvic pressure.  Your baby is not moving inside you as much as it used to. Summary  Contractions that occur before labor are called Braxton Hicks contractions, false labor, or practice contractions.  Braxton Hicks contractions are usually shorter, weaker, farther apart, and less regular than true labor contractions. True labor contractions usually become progressively stronger and regular and they become more frequent.  Manage discomfort from [employer] LLCBraxton Hicks contractions by changing position, resting in a warm bath, drinking plenty of water, or practicing deep breathing. This information is not intended to replace advice given to you by your health care provider. Make sure you discuss any questions you have with your health care provider. Document Released: 03/10/2017 Document Revised: 03/10/2017 Document Reviewed: 03/10/2017 Elsevier Interactive Patient Education  2018 [employer]  Safe Medications in Pregnancy   Acne: Benzoyl Peroxide Salicylic Acid  Backache/Headache: Tylenol: 2 regular strength every 4 hours OR              2 Extra strength every 6 hours  Colds/Coughs/Allergies: Benadryl (alcohol free) 25 mg every 6 hours as needed Breath right strips Claritin Cepacol throat lozenges Chloraseptic  throat spray Cold-Eeze- up to three times per day Cough drops, alcohol free Flonase (by prescription only) Guaifenesin Mucinex Robitussin DM (plain only, alcohol free) Saline nasal spray/drops Sudafed (pseudoephedrine) & Actifed ** use only after [redacted] weeks gestation and if you do not have high blood pressure Tylenol Vicks  Vaporub Zinc lozenges Zyrtec   Constipation: Colace Ducolax suppositories Fleet enema Glycerin suppositories Metamucil Milk of magnesia Miralax Senokot Smooth move tea  Diarrhea: Kaopectate Imodium A-D  *NO pepto Bismol  Hemorrhoids: Anusol Anusol HC Preparation H Tucks  Indigestion: Tums Maalox Mylanta Zantac  Pepcid  Insomnia: Benadryl (alcohol free) 25mg  every 6 hours as needed Tylenol PM Unisom, no Gelcaps  Leg Cramps: Tums MagGel  Nausea/Vomiting:  Bonine Dramamine Emetrol Ginger extract Sea bands Meclizine  Nausea medication to take during pregnancy:  Unisom (doxylamine succinate 25 mg tablets) Take one tablet daily at bedtime. If symptoms are not adequately controlled, the dose can be increased to a maximum recommended dose of two tablets daily (1/2 tablet in the morning, 1/2 tablet mid-afternoon and one at bedtime). Vitamin B6 100mg  tablets. Take one tablet twice a day (up to 200 mg per day).  Skin Rashes: Aveeno products Benadryl cream or 25mg  every 6 hours as needed Calamine Lotion 1% cortisone cream  Yeast infection: Gyne-lotrimin 7 Monistat 7   **If taking multiple medications, please check labels to avoid duplicating the same active ingredients **take medication as directed on the label ** Do not exceed 4000 mg of tylenol in 24 hours **Do not take medications that contain aspirin or ibuprofen   AREA PEDIATRIC/FAMILY PRACTICE PHYSICIANS  [facility] [address] [place], [place]  [address] Phone - [phone]   Fax - [phone]  [facility] [address] [place], [place] [address] Phone - [phone]   Fax - [phone]  [facility] [address] [place], [place]  [address] Phone - [phone]   Fax - [phone]  [facility] [address] [place], [place]  [address] Phone - [phone]   Fax - [phone]  [facility] OF [facility]  [facility] [address] [place], [place]  [address] Phone - [phone]   Fax - [phone]  [facility] [address] [place], [place]  [address] Phone - [phone]   Fax - [phone]  [facility] [address] [place], [place]  [address] Phone - [phone]   Fax - [phone]  [facility] [address] [place], [address] [place], [place]  [address] Phone - [phone]   Fax - [phone]  [facility] AT [facility] [address] [place], [place]  [address] Phone - [phone]   Fax - [phone] [facility] AT [place] [address] [place], [place]  [address] Phone - [phone]   Fax - [phone]  [facility] [address] [place], [place]  [address] Phone - [phone]   Fax - [phone]  [facility] AT [facility] [address] [place], [place]  [address] Phone - [phone]   Fax - [phone]  [facility] [address] [place], [place]  [address] Phone - [phone]   Fax - [phone]  [facility] [address] [place], [place]  [address] Phone - [phone]  [facility] [address] [place], [place]  [address] Phone - [phone]   Fax - [phone]  [facility] [address] [place], [place]  [address] Phone - [phone]   Fax - [phone]  [facility] [address] [place], [place]  [address] Phone - [phone]   Fax - [phone]  [facility] [address] [place], [place]  [address] Phone - [phone]   Fax - [phone]   [facility] [address] [place], [place]  [address] Phone - [phone]   Fax - [phone]  [facility] [address] [place], [place]  [address] Phone - [phone]    Fax - [phone]  [facility] RUBIN [address] [place], [place]  [address] Phone - [phone]   Fax - [phone]  [facility] [address] [place], [place]  [address] Phone - [phone]   Fax - [phone]  [facility] Al[facility] [address] [place], [place]  [address] Phone - [phone]   Fax - [phone] Gerarda Fraction [address] [place], [place]  [address] Phone - [phone]   Fax - [phone]  [facility] [address] [place], [place]  [address] Phone - [phone]   Fax - [phone]  [facility] - [place] [address] [place], [place]  [address] Phone - [phone]   Fax - [phone]  [facility] [place] Wyvonne Lenz MD [address] [place] [address] Phone [phone]  Fax [phone]

## 2017-11-24 LAB — HEMOGLOBIN A1C
Est. average glucose Bld gHb Est-mCnc: 108 mg/dL
Hgb A1c MFr Bld: 5.4 % (ref 4.8–5.6)

## 2017-11-24 LAB — GLUCOSE TOLERANCE, 2 HOURS W/ 1HR
GLUCOSE, 2 HOUR: 95 mg/dL (ref 65–152)
Glucose, 1 hour: 99 mg/dL (ref 65–179)
Glucose, Fasting: 81 mg/dL (ref 65–91)

## 2017-12-09 ENCOUNTER — Ambulatory Visit (INDEPENDENT_AMBULATORY_CARE_PROVIDER_SITE_OTHER): Payer: Medicaid Other | Admitting: Obstetrics and Gynecology

## 2017-12-09 ENCOUNTER — Other Ambulatory Visit (HOSPITAL_COMMUNITY)
Admission: RE | Admit: 2017-12-09 | Discharge: 2017-12-09 | Disposition: A | Discharge status: Home / Self Care | Payer: Medicaid Other | Source: Ambulatory Visit | Attending: Obstetrics and Gynecology | Admitting: Obstetrics and Gynecology

## 2017-12-09 ENCOUNTER — Encounter: Payer: Self-pay | Admitting: Obstetrics and Gynecology

## 2017-12-09 VITALS — BP 131/74 | HR 82 | Wt 215.3 lb

## 2017-12-09 DIAGNOSIS — Z6791 Unspecified blood type, Rh negative: Secondary | ICD-10-CM

## 2017-12-09 DIAGNOSIS — Z3483 Encounter for supervision of other normal pregnancy, third trimester: Secondary | ICD-10-CM | POA: Diagnosis not present

## 2017-12-09 DIAGNOSIS — O09893 Supervision of other high risk pregnancies, third trimester: Secondary | ICD-10-CM

## 2017-12-09 DIAGNOSIS — O26893 Other specified pregnancy related conditions, third trimester: Secondary | ICD-10-CM

## 2017-12-09 DIAGNOSIS — O99013 Anemia complicating pregnancy, third trimester: Secondary | ICD-10-CM

## 2017-12-09 DIAGNOSIS — Z3480 Encounter for supervision of other normal pregnancy, unspecified trimester: Secondary | ICD-10-CM | POA: Diagnosis present

## 2017-12-09 LAB — OB RESULTS CONSOLE GBS: STREP GROUP B AG: POSITIVE

## 2017-12-09 LAB — OB RESULTS CONSOLE GC/CHLAMYDIA: GC PROBE AMP, GENITAL: NEGATIVE

## 2017-12-09 NOTE — Progress Notes (Signed)
Subjective:  Tracie Valenzuela is a 32 y.o. [id number] at 5379w2d being seen today for ongoing prenatal care.  She is currently monitored for the following issues for this low-risk pregnancy and has Rh negative status during pregnancy; Encounter for supervision of other normal pregnancy, unspecified trimester; Late prenatal care affecting pregnancy; Nausea/vomiting in pregnancy; and Anemia affecting pregnancy in third trimester on their problem list.  Patient reports no complaints.  Contractions: Irritability. Vag. Bleeding: None.  Movement: Present. Denies leaking of fluid.   The following portions of the patient's history were reviewed and updated as appropriate: allergies, current medications, past family history, past medical history, past social history, past surgical history and problem list. Problem list updated.  Objective:   Vitals:   12/09/17 1144  BP: 131/74  Pulse: 82  Weight: 97.7 kg (215 lb 4.8 oz)    Fetal Status: Fetal Heart Rate (bpm): 135 Fundal Height: 37 cm Movement: Present     General:  Alert, oriented and cooperative. Patient is in no acute distress.  Skin: Skin is warm and dry. No rash noted.   Cardiovascular: Normal heart rate noted  Respiratory: Normal respiratory effort, no problems with respiration noted  Abdomen: Soft, gravid, appropriate for gestational age. Pain/Pressure: Present     Pelvic: Vag. Bleeding: None     Cervical exam deferred        Extremities: Normal range of motion.  Edema: None  Mental Status: Normal mood and affect. Normal behavior. Normal judgment and thought content.   Urinalysis:      Assessment and Plan:  Pregnancy: [id number] at 8479w2d  1. Encounter for supervision of other normal pregnancy, unspecified trimester Doing well. GBS collected today along with cultures.  - GC/Chlamydia probe amp ([facility] - Culture, beta strep (group b only)  2. Rh negative status during pregnancy in third trimester Received Rhogam. Test  postpartum.  3. Anemia affecting pregnancy in third trimester Taking her iron BID. Will collect CBC.  - CBC  Term labor symptoms and general obstetric precautions including but not limited to vaginal bleeding, contractions, leaking of fluid and fetal movement were reviewed in detail with the patient. Please refer to After Visit Summary for other counseling recommendations.  Return in about 1 week (around 12/16/2017) for ob visit.   Pincus LargePhelps, Amaad Byers Y, DO

## 2017-12-10 LAB — CBC
HEMATOCRIT: 30.4 % — AB (ref 34.0–46.6)
Hemoglobin: 9.8 g/dL — ABNORMAL LOW (ref 11.1–15.9)
MCH: 27.8 pg (ref 26.6–33.0)
MCHC: 32.2 g/dL (ref 31.5–35.7)
MCV: 86 fL (ref 79–97)
PLATELETS: 269 10*3/uL (ref 150–379)
RBC: 3.53 x10E6/uL — ABNORMAL LOW (ref 3.77–5.28)
RDW: 14.2 % (ref 12.3–15.4)
WBC: 10.1 10*3/uL (ref 3.4–10.8)

## 2017-12-12 LAB — GC/CHLAMYDIA PROBE AMP (~~LOC~~) NOT AT ARMC
CHLAMYDIA, DNA PROBE: NEGATIVE
Neisseria Gonorrhea: NEGATIVE

## 2017-12-12 LAB — CULTURE, BETA STREP (GROUP B ONLY): Strep Gp B Culture: POSITIVE — AB

## 2017-12-15 ENCOUNTER — Encounter: Payer: Medicaid Other | Admitting: Obstetrics and Gynecology

## 2017-12-16 ENCOUNTER — Ambulatory Visit (INDEPENDENT_AMBULATORY_CARE_PROVIDER_SITE_OTHER): Payer: Medicaid Other | Admitting: Obstetrics and Gynecology

## 2017-12-16 VITALS — BP 117/75 | HR 90 | Wt 219.3 lb

## 2017-12-16 DIAGNOSIS — O26899 Other specified pregnancy related conditions, unspecified trimester: Secondary | ICD-10-CM

## 2017-12-16 DIAGNOSIS — Z6791 Unspecified blood type, Rh negative: Secondary | ICD-10-CM

## 2017-12-16 DIAGNOSIS — O99013 Anemia complicating pregnancy, third trimester: Secondary | ICD-10-CM

## 2017-12-16 DIAGNOSIS — O09893 Supervision of other high risk pregnancies, third trimester: Secondary | ICD-10-CM

## 2017-12-16 DIAGNOSIS — D649 Anemia, unspecified: Secondary | ICD-10-CM

## 2017-12-16 MED ORDER — FLINTSTONES PLUS IRON PO CHEW: 1.0000 | CHEWABLE_TABLET | Freq: Two times a day (BID) | ORAL | 1 refills | Status: DC

## 2017-12-16 NOTE — Progress Notes (Signed)
   PRENATAL VISIT NOTE  Subjective:  Tracie Valenzuela is a 32 y.o. [id number] at 5673w2d being seen today for ongoing prenatal care.  She is currently monitored for the following issues for this low-risk pregnancy and has Rh negative status during pregnancy; Encounter for supervision of other normal pregnancy, unspecified trimester; Late prenatal care affecting pregnancy; Nausea/vomiting in pregnancy; and Anemia affecting pregnancy in third trimester on their problem list.  Patient reports no complaints.  Contractions: Irritability. Vag. Bleeding: None.  Movement: Present. Denies leaking of fluid.   The following portions of the patient's history were reviewed and updated as appropriate: allergies, current medications, past family history, past medical history, past social history, past surgical history and problem list. Problem list updated.  Objective:   Vitals:   12/16/17 1001  BP: 117/75  Pulse: 90  Weight: 219 lb 4.8 oz (99.5 kg)    Fetal Status: Fetal Heart Rate (bpm): 143 Fundal Height: 38 cm Movement: Present     General:  Alert, oriented and cooperative. Patient is in no acute distress.  Skin: Skin is warm and dry. No rash noted.   Cardiovascular: Normal heart rate noted  Respiratory: Normal respiratory effort, no problems with respiration noted  Abdomen: Soft, gravid, appropriate for gestational age.  Pain/Pressure: Absent     Pelvic: Cervical exam deferred        Extremities: Normal range of motion.     Mental Status:  Normal mood and affect. Normal behavior. Normal judgment and thought content.   Assessment and Plan:  Pregnancy: [id number] at 4373w2d  Doing well, discussed GBS today   1. Anemia affecting pregnancy in third trimester  [place] vitamins with iron RX  2. Rh negative, antepartum  Received rhogam at 28 weeks   There are no diagnoses linked to this encounter. Term labor symptoms and general obstetric precautions including but not limited to vaginal bleeding,  contractions, leaking of fluid and fetal movement were reviewed in detail with the patient. Please refer to After Visit Summary for other counseling recommendations.  Return in about 1 week (around 12/23/2017).   Venia CarbonJennifer Rasch, NP

## 2017-12-16 NOTE — Patient Instructions (Signed)

## 2017-12-21 ENCOUNTER — Encounter: Payer: Self-pay | Admitting: Medical

## 2017-12-21 ENCOUNTER — Ambulatory Visit (INDEPENDENT_AMBULATORY_CARE_PROVIDER_SITE_OTHER): Payer: Medicaid Other | Admitting: Medical

## 2017-12-21 VITALS — BP 122/69 | HR 88 | Wt 216.0 lb

## 2017-12-21 DIAGNOSIS — O09899 Supervision of other high risk pregnancies, unspecified trimester: Secondary | ICD-10-CM

## 2017-12-21 DIAGNOSIS — O26899 Other specified pregnancy related conditions, unspecified trimester: Secondary | ICD-10-CM

## 2017-12-21 DIAGNOSIS — O99013 Anemia complicating pregnancy, third trimester: Secondary | ICD-10-CM

## 2017-12-21 DIAGNOSIS — Z6791 Unspecified blood type, Rh negative: Secondary | ICD-10-CM

## 2017-12-21 DIAGNOSIS — Z3480 Encounter for supervision of other normal pregnancy, unspecified trimester: Secondary | ICD-10-CM

## 2017-12-21 NOTE — Progress Notes (Signed)
   PRENATAL VISIT NOTE  Subjective:  Tracie Valenzuela is a 32 y.o. [id number] at 259w0d being seen today for ongoing prenatal care.  She is currently monitored for the following issues for this low-risk pregnancy and has Rh negative status during pregnancy; Encounter for supervision of other normal pregnancy, unspecified trimester; Late prenatal care affecting pregnancy; Nausea/vomiting in pregnancy; and Anemia affecting pregnancy in third trimester on their problem list.  Patient reports no complaints.  Contractions: Irritability. Vag. Bleeding: None.  Movement: Present. Denies leaking of fluid.   The following portions of the patient's history were reviewed and updated as appropriate: allergies, current medications, past family history, past medical history, past social history, past surgical history and problem list. Problem list updated.  Objective:   Vitals:   12/21/17 1026  BP: 122/69  Pulse: 88  Weight: 216 lb (98 kg)    Fetal Status: Fetal Heart Rate (bpm): 144 Fundal Height: 39 cm Movement: Present     General:  Alert, oriented and cooperative. Patient is in no acute distress.  Skin: Skin is warm and dry. No rash noted.   Cardiovascular: Normal heart rate noted  Respiratory: Normal respiratory effort, no problems with respiration noted  Abdomen: Soft, gravid, appropriate for gestational age.  Pain/Pressure: Absent     Pelvic: Cervical exam deferred        Extremities: Normal range of motion.  Edema: None  Mental Status:  Normal mood and affect. Normal behavior. Normal judgment and thought content.   Assessment and Plan:  Pregnancy: [id number] at 599w0d  1. Encounter for supervision of other normal pregnancy, unspecified trimester - Doing well  2. Anemia affecting pregnancy in third trimester - CBC to recheck today prior to delivery  3. Rh negative, antepartum - Rhogam 10/21/17  Term labor symptoms and general obstetric precautions including but not limited to vaginal  bleeding, contractions, leaking of fluid and fetal movement were reviewed in detail with the patient. Please refer to After Visit Summary for other counseling recommendations.  Return in about 1 week (around 12/28/2017) for LOB.   Vonzella NippleJulie Olney Monier, PA-C

## 2017-12-21 NOTE — Patient Instructions (Signed)

## 2017-12-22 ENCOUNTER — Encounter (HOSPITAL_COMMUNITY): Payer: Self-pay

## 2017-12-22 ENCOUNTER — Inpatient Hospital Stay (HOSPITAL_COMMUNITY)
Admission: AD | Admit: 2017-12-22 | Discharge: 2017-12-24 | DRG: 807 | Disposition: A | Discharge status: Home / Self Care | Payer: Medicaid Other | Source: Ambulatory Visit | Attending: Obstetrics & Gynecology | Admitting: Obstetrics & Gynecology

## 2017-12-22 DIAGNOSIS — Z3480 Encounter for supervision of other normal pregnancy, unspecified trimester: Secondary | ICD-10-CM

## 2017-12-22 DIAGNOSIS — Z880 Allergy status to penicillin: Secondary | ICD-10-CM

## 2017-12-22 DIAGNOSIS — O99824 Streptococcus B carrier state complicating childbirth: Principal | ICD-10-CM | POA: Diagnosis present

## 2017-12-22 DIAGNOSIS — Z3A38 38 weeks gestation of pregnancy: Secondary | ICD-10-CM | POA: Diagnosis not present

## 2017-12-22 DIAGNOSIS — Z3483 Encounter for supervision of other normal pregnancy, third trimester: Secondary | ICD-10-CM | POA: Diagnosis present

## 2017-12-22 LAB — CBC
HCT: 31.3 % — ABNORMAL LOW (ref 36.0–46.0)
Hematocrit: 31.6 % — ABNORMAL LOW (ref 34.0–46.6)
Hemoglobin: 10.3 g/dL — ABNORMAL LOW (ref 11.1–15.9)
Hemoglobin: 10.7 g/dL — ABNORMAL LOW (ref 12.0–15.0)
MCH: 28.1 pg (ref 26.6–33.0)
MCH: 28.9 pg (ref 26.0–34.0)
MCHC: 32.6 g/dL (ref 31.5–35.7)
MCHC: 34.2 g/dL (ref 30.0–36.0)
MCV: 86 fL (ref 79–97)
MCV: 84.6 fL (ref 78.0–100.0)
PLATELETS: 304 10*3/uL (ref 150–379)
PLATELETS: 264 10*3/uL (ref 150–400)
RBC: 3.67 x10E6/uL — AB (ref 3.77–5.28)
RBC: 3.7 MIL/uL — ABNORMAL LOW (ref 3.87–5.11)
RDW: 14.7 % (ref 12.3–15.4)
RDW: 13.9 % (ref 11.5–15.5)
WBC: 9.8 10*3/uL (ref 3.4–10.8)
WBC: 19.6 10*3/uL — ABNORMAL HIGH (ref 4.0–10.5)

## 2017-12-22 LAB — TYPE AND SCREEN
ABO/RH(D): A NEG
Antibody Screen: NEGATIVE

## 2017-12-22 MED ORDER — HYDROXYZINE HCL 50 MG PO TABS
50.0000 mg | ORAL_TABLET | Freq: Four times a day (QID) | ORAL | Status: DC | PRN
  Filled 2017-12-22: qty 1

## 2017-12-22 MED ORDER — LACTATED RINGERS IV SOLN: 500.0000 mL | INTRAVENOUS | Status: DC | PRN

## 2017-12-22 MED ORDER — OXYCODONE-ACETAMINOPHEN 5-325 MG PO TABS: 1.0000 | ORAL_TABLET | ORAL | Status: DC | PRN

## 2017-12-22 MED ORDER — LACTATED RINGERS IV SOLN: INTRAVENOUS | Status: DC

## 2017-12-22 MED ORDER — FENTANYL CITRATE (PF) 100 MCG/2ML IJ SOLN: 50.0000 ug | INTRAMUSCULAR | Status: DC | PRN

## 2017-12-22 MED ORDER — LIDOCAINE HCL (PF) 1 % IJ SOLN
30.0000 mL | INTRAMUSCULAR | Status: DC | PRN
  Filled 2017-12-22: qty 30

## 2017-12-22 MED ORDER — FLEET ENEMA 7-19 GM/118ML RE ENEM: 1.0000 | ENEMA | RECTAL | Status: DC | PRN

## 2017-12-22 MED ORDER — ONDANSETRON HCL 4 MG/2ML IJ SOLN: 4.0000 mg | Freq: Four times a day (QID) | INTRAMUSCULAR | Status: DC | PRN

## 2017-12-22 MED ORDER — OXYTOCIN 40 UNITS IN LACTATED RINGERS INFUSION - SIMPLE MED
INTRAVENOUS | Status: AC
  Filled 2017-12-22: qty 1000

## 2017-12-22 MED ORDER — SOD CITRATE-CITRIC ACID 500-334 MG/5ML PO SOLN: 30.0000 mL | ORAL | Status: DC | PRN

## 2017-12-22 MED ORDER — LIDOCAINE HCL (PF) 1 % IJ SOLN
INTRAMUSCULAR | Status: AC
  Filled 2017-12-22: qty 30

## 2017-12-22 MED ORDER — OXYTOCIN 40 UNITS IN LACTATED RINGERS INFUSION - SIMPLE MED: 2.5000 [IU]/h | INTRAVENOUS | Status: DC

## 2017-12-22 MED ORDER — SODIUM CHLORIDE 0.9 % IV SOLN
2.0000 g | Freq: Once | INTRAVENOUS | Status: AC
  Administered 2017-12-22: 2 g via INTRAVENOUS
  Filled 2017-12-22: qty 2

## 2017-12-22 MED ORDER — OXYCODONE-ACETAMINOPHEN 5-325 MG PO TABS: 2.0000 | ORAL_TABLET | ORAL | Status: DC | PRN

## 2017-12-22 MED ORDER — OXYTOCIN BOLUS FROM INFUSION
500.0000 mL | Freq: Once | INTRAVENOUS | Status: AC
  Administered 2017-12-22: 500 mL via INTRAVENOUS

## 2017-12-22 MED ORDER — ACETAMINOPHEN 325 MG PO TABS: 650.0000 mg | ORAL_TABLET | ORAL | Status: DC | PRN

## 2017-12-22 NOTE — H&P (Signed)
Tracie Valenzuela is a 32 y.o. female [phone] with IUP at [redacted]w[redacted]d presenting for contractions. Pt states she has been having regular, every 2-3 minutes contractions, associated with scant staining vaginal bleeding for several hours..  Membranes are intact, with active fetal movement.   [facility] wks  Prenatal History/Complications: Late cre Past Medical History: Past Medical History:  Diagnosis Date  . Medical history non-contributory     Past Surgical History: Past Surgical History:  Procedure Laterality Date  . ACHILLES TENDON REPAIR    . MENISCUS REPAIR      Obstetrical History: OB History    Gravida Para Term Preterm AB Living   5 2 2  0 2 2   SAB TAB Ectopic Multiple Live Births   0 2 0 0 2       Social History: Social History   Socioeconomic History  . Marital status: Single    Spouse name: Not on file  . Number of children: Not on file  . Years of education: Not on file  . Highest education level: Not on file  Social Needs  . Financial resource strain: Not on file  . Food insecurity - worry: Not on file  . Food insecurity - inability: Not on file  . Transportation needs - medical: Not on file  . Transportation needs - non-medical: Not on file  Occupational History  . Not on file  Tobacco Use  . Smoking status: Never Smoker  . Smokeless tobacco: Never Used  Substance and Sexual Activity  . Alcohol use: No  . Drug use: No    Comment: Last used [facility] 03/14  . Sexual activity: Yes  Other Topics Concern  . Not on file  Social History Narrative  . Not on file    Family History: Family History  Problem Relation Age of Onset  . Hypertension Paternal Grandmother   . Diabetes Paternal Grandmother     Allergies: No Known Allergies  Medications Prior to Admission  Medication Sig Dispense Refill Last Dose  . ferrous sulfate 325 (65 FE) MG tablet Take 1 tablet (325 mg total) by mouth daily with breakfast. 30 tablet 2 Taking  . Pediatric  Multivitamins-Iron (FLINTSTONES PLUS IRON) chewable tablet Chew 1 tablet by mouth 2 (two) times daily. 60 tablet 1 Taking  . Prenatal Multivit-Min-Fe-FA (PRENATAL VITAMINS) 0.8 MG tablet Take 1 tablet by mouth daily. 30 tablet 12 Taking        Review of Systems   Constitutional: Negative for fever and chills Eyes: Negative for visual disturbances Respiratory: Negative for shortness of breath, dyspnea Cardiovascular: Negative for chest pain or palpitations  Gastrointestinal: Negative for vomiting, diarrhea and constipation.  POSITIVE for abdominal pain (contractions) Genitourinary: Negative for dysuria and urgency Musculoskeletal: Negative for back pain, joint pain, myalgias  Neurological: Negative for dizziness and headaches      Last menstrual period 04/09/2017, unknown if currently breastfeeding. General appearance: alert, cooperative and no distress Lungs: clear to auscultation bilaterally Heart: regular rate and rhythm Abdomen: soft, non-tender; bowel sounds normal Extremities: Homans sign is negative, no sign of DVT DTR's 2+ Presentation: cephalic Fetal monitoring  Baseline: 140 bpm, Variability: Good {> 6 bpm), Accelerations: Reactive and Decelerations: Absent Uterine activity  3-4    Clinic  [facility] Prenatal Labs  Dating  17 wk [place] Blood type: A/Negative/-- (09/24 0934)   Genetic Screen 1 Screen:    AFP:     Quad:     NIPS: Antibody:Negative (09/24 0934)  Anatomic [place]  nml female, anterior placenta Rubella: 1.14 (09/24 0934)  GTT Third trimester: 1 hr - 111 (could not tolerate 2 hour, fasting test without emesis) repeat 1 hour with A1C at next visit  RPR: Non Reactive (12/17 0822)   Flu vaccine Declined 08/29/17 and 09-26-17 HBsAg: Negative (09/24 0934)   TDaP vaccine 10/21/17                            Rhogam:10/21/17 HIV: Non Reactive (12/17 [id number]) Non-reactive   Baby Food  bottle                                           GBS: POSITIVE (For PCN allergy, check sensitivities)   Contraception pills Pap: NIL (08/01/17)  Circumcision ouptatient- list given   Pediatrician list given   Support Person Arloa KohBarry Risser (Dad)      Prenatal labs: ABO, Rh: A/Negative/-- (09/24 0934) Antibody: Negative (09/24 0934) Rubella: 1.14 (09/24 0934) RPR: Non Reactive (12/17 0822)  HBsAg: Negative (09/24 0934)  HIV: Non Reactive (12/17 [id number])  GBS: Positive (02/01 0000)    Prenatal Transfer Tool  Maternal Diabetes: No Genetic Screening: Declined Maternal Ultrasounds/Referrals: Normal Fetal Ultrasounds or other Referrals:  None Maternal Substance Abuse:  No Significant Maternal Medications:  None Significant Maternal Lab Results: Lab values include: Group B Strep positive     No results found for this or any previous visit (from the past 24 hour(s)).  Assessment: Tracie Valenzuela is a 32 y.o. [phone] with an IUP at 9230w1d presenting for active labor  Plan: #Labor: expectant management #Pain:  Per request #FWB Cat 1 #GBS  Ampicillin   Jacklyn ShellFrances Cresenzo-Dishmon 12/22/2017, 11:03 PM

## 2017-12-23 ENCOUNTER — Encounter (HOSPITAL_COMMUNITY): Payer: Self-pay

## 2017-12-23 DIAGNOSIS — O99824 Streptococcus B carrier state complicating childbirth: Secondary | ICD-10-CM

## 2017-12-23 DIAGNOSIS — Z3A38 38 weeks gestation of pregnancy: Secondary | ICD-10-CM

## 2017-12-23 LAB — RPR: RPR Ser Ql: NONREACTIVE

## 2017-12-23 MED ORDER — ONDANSETRON HCL 4 MG PO TABS: 4.0000 mg | ORAL_TABLET | ORAL | Status: DC | PRN

## 2017-12-23 MED ORDER — ACETAMINOPHEN 325 MG PO TABS: 650.0000 mg | ORAL_TABLET | ORAL | Status: DC | PRN

## 2017-12-23 MED ORDER — TETANUS-DIPHTH-ACELL PERTUSSIS 5-2.5-18.5 LF-MCG/0.5 IM SUSP: 0.5000 mL | Freq: Once | INTRAMUSCULAR | Status: DC

## 2017-12-23 MED ORDER — SIMETHICONE 80 MG PO CHEW: 80.0000 mg | CHEWABLE_TABLET | ORAL | Status: DC | PRN

## 2017-12-23 MED ORDER — PRENATAL MULTIVITAMIN CH
1.0000 | ORAL_TABLET | Freq: Every day | ORAL | Status: DC
  Administered 2017-12-23: 1 via ORAL
  Filled 2017-12-23: qty 1

## 2017-12-23 MED ORDER — ONDANSETRON HCL 4 MG/2ML IJ SOLN: 4.0000 mg | INTRAMUSCULAR | Status: DC | PRN

## 2017-12-23 MED ORDER — DIBUCAINE 1 % RE OINT: 1.0000 "application " | TOPICAL_OINTMENT | RECTAL | Status: DC | PRN

## 2017-12-23 MED ORDER — IBUPROFEN 600 MG PO TABS
600.0000 mg | ORAL_TABLET | Freq: Four times a day (QID) | ORAL | Status: DC
  Administered 2017-12-23 – 2017-12-24 (×5): 600 mg via ORAL
  Filled 2017-12-23 (×6): qty 1

## 2017-12-23 MED ORDER — COCONUT OIL OIL: 1.0000 "application " | TOPICAL_OIL | Status: DC | PRN

## 2017-12-23 MED ORDER — BISACODYL 10 MG RE SUPP: 10.0000 mg | Freq: Every day | RECTAL | Status: DC | PRN

## 2017-12-23 MED ORDER — FERROUS SULFATE 325 (65 FE) MG PO TABS
325.0000 mg | ORAL_TABLET | Freq: Two times a day (BID) | ORAL | Status: DC
  Administered 2017-12-23 (×2): 325 mg via ORAL
  Filled 2017-12-23 (×2): qty 1

## 2017-12-23 MED ORDER — DIPHENHYDRAMINE HCL 25 MG PO CAPS: 25.0000 mg | ORAL_CAPSULE | Freq: Four times a day (QID) | ORAL | Status: DC | PRN

## 2017-12-23 MED ORDER — WITCH HAZEL-GLYCERIN EX PADS: 1.0000 "application " | MEDICATED_PAD | CUTANEOUS | Status: DC | PRN

## 2017-12-23 MED ORDER — METHYLERGONOVINE MALEATE 0.2 MG/ML IJ SOLN: 0.2000 mg | INTRAMUSCULAR | Status: DC | PRN

## 2017-12-23 MED ORDER — METHYLERGONOVINE MALEATE 0.2 MG PO TABS: 0.2000 mg | ORAL_TABLET | ORAL | Status: DC | PRN

## 2017-12-23 MED ORDER — RHO D IMMUNE GLOBULIN 1500 UNIT/2ML IJ SOSY
300.0000 ug | PREFILLED_SYRINGE | Freq: Once | INTRAMUSCULAR | Status: AC
  Administered 2017-12-23: 300 ug via INTRAVENOUS
  Filled 2017-12-23: qty 2

## 2017-12-23 MED ORDER — OXYCODONE HCL 5 MG PO TABS: 5.0000 mg | ORAL_TABLET | ORAL | Status: DC | PRN

## 2017-12-23 MED ORDER — ZOLPIDEM TARTRATE 5 MG PO TABS: 5.0000 mg | ORAL_TABLET | Freq: Every evening | ORAL | Status: DC | PRN

## 2017-12-23 MED ORDER — DOCUSATE SODIUM 100 MG PO CAPS
100.0000 mg | ORAL_CAPSULE | Freq: Two times a day (BID) | ORAL | Status: DC
  Administered 2017-12-23: 100 mg via ORAL
  Filled 2017-12-23: qty 1

## 2017-12-23 MED ORDER — MEASLES, MUMPS & RUBELLA VAC ~~LOC~~ INJ: 0.5000 mL | INJECTION | Freq: Once | SUBCUTANEOUS | Status: DC

## 2017-12-23 MED ORDER — FLEET ENEMA 7-19 GM/118ML RE ENEM: 1.0000 | ENEMA | Freq: Every day | RECTAL | Status: DC | PRN

## 2017-12-23 MED ORDER — BENZOCAINE-MENTHOL 20-0.5 % EX AERO: 1.0000 "application " | INHALATION_SPRAY | CUTANEOUS | Status: DC | PRN

## 2017-12-23 MED ORDER — OXYCODONE HCL 5 MG PO TABS: 10.0000 mg | ORAL_TABLET | ORAL | Status: DC | PRN

## 2017-12-23 NOTE — Progress Notes (Signed)
CSW received consult for hx of marijuana use in 2015.  Referral was screened out due to the following: ~MOB had no documented substance use after initial prenatal visit/+UPT. ~MOB had no positive drug screens after initial prenatal visit/+UPT. ~Baby's UDS is negative.  Please consult CSW if current concerns arise or by MOB's request.  Deretha EmoryHannah Willo Yoon LCSW, MSW Clinical Social Work: System Wide Float Coverage for :  [phone]

## 2017-12-23 NOTE — Progress Notes (Signed)
POSTPARTUM PROGRESS NOTE  Post Partum Day 1  Subjective:  Hulen SkainsFantasia Cauble is a 32 y.o. [id number] s/p SVD at 2174w1d.  No acute events overnight.  Pt denies problems with ambulating, voiding or po intake.  She denies nausea or vomiting.  Pain is well controlled.  She has had flatus. She has had bowel movement.  Lochia Minimal.   Objective: Blood pressure 118/73, pulse 75, temperature 98.1 F (36.7 C), temperature source Oral, resp. rate 16, height 5\' 10"  (1.778 m), weight 98 kg (216 lb), last menstrual period 04/09/2017, unknown if currently breastfeeding.  Physical Exam:  General: alert, cooperative and no distress Chest: no respiratory distress Heart:regular rate, distal pulses intact Abdomen: soft, nontender Uterine Fundus: firm, appropriately tender DVT Evaluation: No calf swelling or tenderness Extremities: no peripheral edema   Recent Labs    12/21/17 1102 12/22/17 2308  HGB 10.3* 10.7*  HCT 31.6* 31.3*    Assessment/Plan: Hulen SkainsFantasia Weiskopf is a 32 y.o. [id number] s/p SVD at 3974w1d  PPD#1 - Doing well.  No complaints at this time.  Patient is A- and antibody negative.  Waiting for placental Rh IG work up to determine if postpartum rhogam is needed.   Contraception: pills Feeding: bottle Dispo: Plan for discharge OCP.   LOS: 1 day   Ames CoupeCharles A Jonathandavid Marlett MS3 12/23/2017, 9:56 AM

## 2017-12-24 LAB — RH IG WORKUP (INCLUDES ABO/RH)
ABO/RH(D): A NEG
FETAL SCREEN: NEGATIVE
Gestational Age(Wks): 38.1
Unit division: 0

## 2017-12-24 MED ORDER — IBUPROFEN 600 MG PO TABS: 600.0000 mg | ORAL_TABLET | Freq: Four times a day (QID) | ORAL | 0 refills | Status: DC

## 2017-12-24 MED ORDER — OXYCODONE-ACETAMINOPHEN 5-325 MG PO TABS: 2.0000 | ORAL_TABLET | ORAL | 0 refills | Status: DC | PRN

## 2017-12-24 NOTE — Discharge Instructions (Signed)
Vaginal Delivery, Care After °Refer to this sheet in the next few weeks. These instructions provide you with information about caring for yourself after vaginal delivery. Your health care provider may also give you more specific instructions. Your treatment has been planned according to current medical practices, but problems sometimes occur. Call your health care provider if you have any problems or questions. °What can I expect after the procedure? °After vaginal delivery, it is common to have: °· Some bleeding from your vagina. °· Soreness in your abdomen, your vagina, and the area of skin between your vaginal opening and your anus (perineum). °· Pelvic cramps. °· Fatigue. ° °Follow these instructions at home: °Medicines °· Take over-the-counter and prescription medicines only as told by your health care provider. °· If you were prescribed an antibiotic medicine, take it as told by your health care provider. Do not stop taking the antibiotic until it is finished. °Driving ° °· Do not drive or operate heavy machinery while taking prescription pain medicine. °· Do not drive for 24 hours if you received a sedative. °Lifestyle °· Do not drink alcohol. This is especially important if you are breastfeeding or taking medicine to relieve pain. °· Do not use tobacco products, including cigarettes, chewing tobacco, or e-cigarettes. If you need help quitting, ask your health care provider. °Eating and drinking °· Drink at least 8 eight-ounce glasses of water every day unless you are told not to by your health care provider. If you choose to breastfeed your baby, you may need to drink more water than this. °· Eat high-fiber foods every day. These foods may help prevent or relieve constipation. High-fiber foods include: °? Whole grain cereals and breads. °? Brown rice. °? Beans. °? Fresh fruits and vegetables. °Activity °· Return to your normal activities as told by your health care provider. Ask your health care provider  what activities are safe for you. °· Rest as much as possible. Try to rest or take a nap when your baby is sleeping. °· Do not lift anything that is heavier than your baby or 10 lb (4.5 kg) until your health care provider says that it is safe. °· Talk with your health care provider about when you can engage in sexual activity. This may depend on your: °? Risk of infection. °? Rate of healing. °? Comfort and desire to engage in sexual activity. °Vaginal Care °· If you have an episiotomy or a vaginal tear, check the area every day for signs of infection. Check for: °? More redness, swelling, or pain. °? More fluid or blood. °? Warmth. °? Pus or a bad smell. °· Do not use tampons or douches until your health care provider says this is safe. °· Watch for any blood clots that may pass from your vagina. These may look like clumps of dark red, brown, or black discharge. °General instructions °· Keep your perineum clean and dry as told by your health care provider. °· Wear loose, comfortable clothing. °· Wipe from front to back when you use the toilet. °· Ask your health care provider if you can shower or take a bath. If you had an episiotomy or a perineal tear during labor and delivery, your health care provider may tell you not to take baths for a certain length of time. °· Wear a bra that supports your breasts and fits you well. °· If possible, have someone help you with household activities and help care for your baby for at least a few days after   you leave the hospital. °· Keep all follow-up visits for you and your baby as told by your health care provider. This is important. °Contact a health care provider if: °· You have: °? Vaginal discharge that has a bad smell. °? Difficulty urinating. °? Pain when urinating. °? A sudden increase or decrease in the frequency of your bowel movements. °? More redness, swelling, or pain around your episiotomy or vaginal tear. °? More fluid or blood coming from your episiotomy or  vaginal tear. °? Pus or a bad smell coming from your episiotomy or vaginal tear. °? A fever. °? A rash. °? Little or no interest in activities you used to enjoy. °? Questions about caring for yourself or your baby. °· Your episiotomy or vaginal tear feels warm to the touch. °· Your episiotomy or vaginal tear is separating or does not appear to be healing. °· Your breasts are painful, hard, or turn red. °· You feel unusually sad or worried. °· You feel nauseous or you vomit. °· You pass large blood clots from your vagina. If you pass a blood clot from your vagina, save it to show to your health care provider. Do not flush blood clots down the toilet without having your health care provider look at them. °· You urinate more than usual. °· You are dizzy or light-headed. °· You have not breastfed at all and you have not had a menstrual period for 12 weeks after delivery. °· You have stopped breastfeeding and you have not had a menstrual period for 12 weeks after you stopped breastfeeding. °Get help right away if: °· You have: °? Pain that does not go away or does not get better with medicine. °? Chest pain. °? Difficulty breathing. °? Blurred vision or spots in your vision. °? Thoughts about hurting yourself or your baby. °· You develop pain in your abdomen or in one of your legs. °· You develop a severe headache. °· You faint. °· You bleed from your vagina so much that you fill two sanitary pads in one hour. °This information is not intended to replace advice given to you by your health care provider. Make sure you discuss any questions you have with your health care provider. °Document Released: 10/22/2000 Document Revised: 04/07/2016 Document Reviewed: 11/09/2015 °Elsevier Interactive Patient Education © 2018 Elsevier Inc. ° °

## 2017-12-24 NOTE — Progress Notes (Signed)
Post Partum Day #2 Subjective: no complaints, up ad lib, voiding and tolerating PO  Objective: Blood pressure 128/89, pulse 81, temperature 98.3 F (36.8 C), temperature source Oral, resp. rate 16, height 5\' 10"  (1.778 m), weight 216 lb (98 kg), last menstrual period 04/09/2017, unknown if currently breastfeeding.  Physical Exam:  General: alert, cooperative and no distress Lochia: appropriate Uterine Fundus: firm Incision: n/a DVT Evaluation: No evidence of DVT seen on physical exam. No cords or calf tenderness. No significant calf/ankle edema.  Recent Labs    12/21/17 1102 12/22/17 2308  HGB 10.3* 10.7*  HCT 31.6* 31.3*    Assessment/Plan: Discharge home and Contraception depo injections   LOS: 2 days   Tracie Valenzuela, CNM 12/24/2017, 6:24 AM

## 2017-12-24 NOTE — Discharge Summary (Signed)
OB Discharge Summary     Patient Name: Tracie SkainsFantasia Vonbehren DOB: [birth year] MRN: [id number]  Date of admission: 12/22/2017 Delivering MD: Jacklyn ShellRESENZO-DISHMON, FRANCES   Date of discharge: 12/24/2017  Admitting diagnosis: 39wks ctx Intrauterine pregnancy: 7453w1d     Secondary diagnosis:  Active Problems:   Normal labor  Additional problems: none     Discharge diagnosis: Term Pregnancy Delivered                                                                                                Post partum procedures:Rhogam  Augmentation: none  Complications: None  Hospital course:  Onset of Labor With Vaginal Delivery     32 y.o. yo [id number] at 953w1d was admitted in Active Labor on 12/22/2017. Patient had an uncomplicated labor course as follows:  Membrane Rupture Time/Date: 11:50 PM ,12/22/2017   Intrapartum Procedures: Episiotomy: None [1]                                         Lacerations:  None [1]  Patient had a delivery of a Viable infant. 12/22/2017  Information for the patient's newborn:  Adella HareGoodwin, Boy Katelynne [[id number]][[id number]]  Delivery Method: Vaginal, Spontaneous(Filed from Delivery Summary)    Pateint had an uncomplicated postpartum course.  She is ambulating, tolerating a regular diet, passing flatus, and urinating well. Patient is discharged home in stable condition on 12/24/17.   Physical exam  Vitals:   12/23/17 0822 12/23/17 1418 12/23/17 1812 12/24/17 0610  BP: 118/73 122/76 129/90 128/89  Pulse: 75 70  81  Resp: 16 16 16 16   Temp: 98.1 F (36.7 C) 98.7 F (37.1 C) 98.2 F (36.8 C) 98.3 F (36.8 C)  TempSrc: Oral Oral Oral Oral  Weight:      Height:       General: alert, cooperative and no distress Lochia: appropriate Uterine Fundus: firm Incision: N/A DVT Evaluation: No evidence of DVT seen on physical exam. No cords or calf tenderness. No significant calf/ankle edema. Labs: Lab Results  Component Value Date   WBC 19.6 (H) 12/22/2017   HGB 10.7 (L)  12/22/2017   HCT 31.3 (L) 12/22/2017   MCV 84.6 12/22/2017   PLT 264 12/22/2017   No flowsheet data found.  Discharge instruction: per After Visit Summary and "Baby and Me Booklet".  After visit meds:  Allergies as of 12/24/2017   No Known Allergies     Medication List    STOP taking these medications   FLINTSTONES PLUS IRON chewable tablet     TAKE these medications   ferrous sulfate 325 (65 FE) MG tablet Take 1 tablet (325 mg total) by mouth daily with breakfast. What changed:  when to take this   ibuprofen 600 MG tablet Commonly known as:  ADVIL,MOTRIN Take 1 tablet (600 mg total) by mouth every 6 (six) hours.   oxyCODONE-acetaminophen 5-325 MG tablet Commonly known as:  PERCOCET/ROXICET Take 2 tablets by mouth every 4 (four) hours as needed (for pain scale equal to or  greater than 7.).   Prenatal Vitamins 0.8 MG tablet Take 1 tablet by mouth daily.       Diet: routine diet  Activity: Advance as tolerated. Pelvic rest for 6 weeks.   Outpatient follow up:4 weeks Follow up Appt: Future Appointments  Date Time Provider Department Center  02/03/2018  9:15 AM Marny Lowenstein, PA-C [facility]   Follow up Visit:No Follow-up on file.  Postpartum contraception: Depo Provera  Newborn Data: Live born female  Birth Weight: 8 lb 1.6 oz (3674 g) APGAR: 9, 9  Newborn Delivery   Birth date/time:  12/22/2017 23:50:00 Delivery type:  Vaginal, Spontaneous     Baby Feeding: Bottle Disposition:home with mother   12/24/2017 Roe Coombs, CNM

## 2017-12-25 NOTE — Clinical Social Work Maternal (Signed)
CLINICAL SOCIAL WORK MATERNAL/CHILD NOTE  Patient Details  Name: Arzu Mcgaughey MRN: [id number] Date of Birth: May 05, 1986  Date:  12/25/2017  Clinical Social Worker Initiating Note:  Marrah Vanevery Lavonna Monarch lcsw Date/Time: Initiated:  12/25/17/      Child's Name:  Lysbeth Galas   Biological Parents:  Mother   Need for Interpreter:  None   Reason for Referral:  Other (Comment)(unclear living situation)   Address:  [place] [address]    Phone number:  [phone] (home)     Additional phone number:   Household Members/Support Persons (HM/SP):       HM/SP Name Relationship DOB or Age  HM/SP -1        HM/SP -2        HM/SP -3        HM/SP -4        HM/SP -5        HM/SP -6        HM/SP -7        HM/SP -8          Natural Supports (not living in the home):  Extended Family, Immediate Family, Parent   Professional Supports: Case Manager/Social Worker(MOB has case worker through [employer])   Employment: Unemployed   Type of Work:     Education:      Homebound arranged:    Museum/gallery curator Resources:  Medicaid   Other Resources:  Physicist, medical , [place]   Cultural/Religious Considerations Which May Impact Care:    Strengths:  Home prepared for child , Pediatrician chosen   Psychotropic Medications:         Pediatrician:    [place] area  Pediatrician List:   [facility] ([address])  [place]      Pediatrician Fax Number:    Risk Factors/Current Problems:  Other (Comment)(FOB incarcerated)   Cognitive State:  Alert , Goal Oriented    Mood/Affect:  Bright , Calm , Comfortable , Happy    CSW Assessment:  LCSW consulted due to MOB's unclear living situation.  LCSW met with MOB at bedside to assess for services.  MOB reported that FOB was incarcerated until August and has been in jail since last August.  MOB reported  that she had been living with her father but stated that the place he lives is under Architect and he had to recently go into a hotel until the construction is complete.  MOB reported that FOB's mother (paternal grandmother) has agreed that she and newborn will reside with her until the construction is complete.  MOB reported that she gets along well with paternal grandmother and stated she is "over the top with excitement" since her son is currently incarcerated.  MOB reported that she has all equipment needed for newborn at discharge and had already pre-registered with [facility] who sent a case worker to her home and provided her with a free car seat.  MOB reported that she had been [facility] her 35 year [facility] d[facility]ther [facility] but was planning on switching pediatricians for both her daughter and her newborn.  MOB has scheduled an appointment for newborn with [facility] on [address].  LCSW educated MOB on post partem and provided her with a checklist to review at discharge.  LCSW recommended MOB follow up with Md should she have any  symptoms.  LCSW provided MOB with a list of support groups provided at [facility] for additional support.    CSW Plan/Description:  No Further Intervention Required/No Barriers to Discharge    Carlean Jews, LCSW 12/25/2017, 11:11 AM

## 2017-12-28 ENCOUNTER — Encounter: Payer: Medicaid Other | Admitting: Advanced Practice Midwife

## 2018-01-25 ENCOUNTER — Other Ambulatory Visit: Payer: Self-pay | Admitting: Certified Nurse Midwife

## 2018-01-25 NOTE — Telephone Encounter (Signed)
Rx Request for oxyCODONE-acetaminophen (PERCOCET/ROXICET) 5-325 MG tablet   Please advise.

## 2018-02-03 ENCOUNTER — Ambulatory Visit (INDEPENDENT_AMBULATORY_CARE_PROVIDER_SITE_OTHER): Payer: Medicaid Other | Admitting: Medical

## 2018-02-03 ENCOUNTER — Encounter: Payer: Self-pay | Admitting: Medical

## 2018-02-03 DIAGNOSIS — Z3009 Encounter for other general counseling and advice on contraception: Secondary | ICD-10-CM

## 2018-02-03 DIAGNOSIS — Z1389 Encounter for screening for other disorder: Secondary | ICD-10-CM | POA: Diagnosis not present

## 2018-02-03 DIAGNOSIS — Z862 Personal history of diseases of the blood and blood-forming organs and certain disorders involving the immune mechanism: Secondary | ICD-10-CM

## 2018-02-03 LAB — POCT PREGNANCY, URINE: Preg Test, Ur: NEGATIVE

## 2018-02-03 MED ORDER — NORGESTIMATE-ETH ESTRADIOL 0.25-35 MG-MCG PO TABS: 1.0000 | ORAL_TABLET | Freq: Every day | ORAL | 11 refills | Status: DC

## 2018-02-03 NOTE — Progress Notes (Signed)
Subjective:     Tracie Valenzuela is a 32 y.o. female who presents for a postpartum visit. She is 6 weeks postpartum following a spontaneous vaginal delivery. I have fully reviewed the prenatal and intrapartum course. The delivery was at 38.1 gestational weeks. Outcome: spontaneous vaginal delivery. Anesthesia: none. Postpartum course has been unremarkable. Baby's course has been unremarkable. Baby is feeding by bottle - Gerber Gentle. Bleeding no bleeding. Bowel function is normal. Bladder function is normal. Patient is not sexually active. Contraception method is abstinence. Patient is interested in OCPs. Postpartum depression screening: negative.  The following portions of the patient's history were reviewed and updated as appropriate: allergies, current medications, past family history, past medical history, past social history, past surgical history and problem list.  Review of Systems Pertinent items are noted in HPI.   Objective:    BP 109/79   Pulse 98   Ht 5\' 10"  (1.778 m)   Wt 192 lb (87.1 kg)   LMP 01/26/2018   Breastfeeding? No   BMI 27.55 kg/m   General:  alert and cooperative   Breasts:  not peformed, not breastfeeding  Lungs: clear to auscultation bilaterally  Heart:  regular rate and rhythm, S1, S2 normal, no murmur, click, rub or gallop  Abdomen: soft, non-tender; bowel sounds normal; no masses,  no organomegaly   Vulva:  not evaluated  Vagina: not evaluated  Cervix:  not evaluated  Corpus: not examined  Adnexa:  not evaluated  Rectal Exam: Not performed.        Assessment:     Normal postpartum exam. Pap smear not done at today's visit.  Normal pap smear 07/2017 Negative pregnancy test  Plan:    1. Contraception: OCP (estrogen/progesterone) Rx sent to pharmacy of choice. Patient educated on how to take OCPs. Will plan to start Sunday. Back-up with condoms x 1 month.  2. Follow up in: 1 year for annual exam and to refill birth control or sooner as needed.     Marny LowensteinWenzel, Julie N, PA-C 02/03/2018 9:56 AM

## 2018-02-03 NOTE — Patient Instructions (Signed)
Oral Contraception Information Oral contraceptive pills (OCPs) are medicines taken to prevent pregnancy. OCPs work by preventing the ovaries from releasing eggs. The hormones in OCPs also cause the cervical mucus to thicken, preventing the sperm from entering the uterus. The hormones also cause the uterine lining to become thin, not allowing a fertilized egg to attach to the inside of the uterus. OCPs are highly effective when taken exactly as prescribed. However, OCPs do not prevent sexually transmitted diseases (STDs). Safe sex practices, such as using condoms along with the pill, can help prevent STDs. Before taking the pill, you may have a physical exam and Pap test. Your health care provider may order blood tests. The health care provider will make sure you are a good candidate for oral contraception. Discuss with your health care provider the possible side effects of the OCP you may be prescribed. When starting an OCP, it can take 2 to 3 months for the body to adjust to the changes in hormone levels in your body. Types of oral contraception  The combination pill-This pill contains estrogen and progestin (synthetic progesterone) hormones. The combination pill comes in 21-day, 28-day, or 91-day packs. Some types of combination pills are meant to be taken continuously (365-day pills). With 21-day packs, you do not take pills for 7 days after the last pill. With 28-day packs, the pill is taken every day. The last 7 pills are without hormones. Certain types of pills have more than 21 hormone-containing pills. With 91-day packs, the first 84 pills contain both hormones, and the last 7 pills contain no hormones or contain estrogen only.  The minipill-This pill contains the progesterone hormone only. The pill is taken every day continuously. It is very important to take the pill at the same time each day. The minipill comes in packs of 28 pills. All 28 pills contain the hormone. Advantages of oral  contraceptive pills  Decreases premenstrual symptoms.  Treats menstrual period cramps.  Regulates the menstrual cycle.  Decreases a heavy menstrual flow.  May treatacne, depending on the type of pill.  Treats abnormal uterine bleeding.  Treats polycystic ovarian syndrome.  Treats endometriosis.  Can be used as emergency contraception. Things that can make oral contraceptive pills less effective OCPs can be less effective if:  You forget to take the pill at the same time every day.  You have a stomach or intestinal disease that lessens the absorption of the pill.  You take OCPs with other medicines that make OCPs less effective, such as antibiotics, certain HIV medicines, and some seizure medicines.  You take expired OCPs.  You forget to restart the pill on day 7, when using the packs of 21 pills.  Risks associated with oral contraceptive pills Oral contraceptive pills can sometimes cause side effects, such as:  Headache.  Nausea.  Breast tenderness.  Irregular bleeding or spotting.  Combination pills are also associated with a small increased risk of:  Blood clots.  Heart attack.  Stroke.  This information is not intended to replace advice given to you by your health care provider. Make sure you discuss any questions you have with your health care provider. Document Released: 01/15/2003 Document Revised: 04/01/2016 Document Reviewed: 04/15/2013 Elsevier Interactive Patient Education  2018 Elsevier Inc.  

## 2018-02-03 NOTE — Progress Notes (Deleted)
Subjective:     Tracie Valenzuela is a 32 y.o. female who presents for a postpartum visit. She is 6 weeks postpartum following a spontaneous vaginal delivery. I have fully reviewed the prenatal and intrapartum course. The delivery was at 38.1 gestational weeks. Outcome: spontaneous vaginal delivery. Anesthesia: none. Postpartum course has been unremarkable. Baby's course has been unremarkable. Baby is feeding by bottle - Gerber Gentle. Bleeding no bleeding. Bowel function is normal. Bladder function is normal. Patient is not sexually active. Contraception method is none. Patient is interested in OCPs. Postpartum depression screening: negative.  {Common ambulatory SmartLinks:19316}  Review of Systems {ros; complete:30496}   Objective:    LMP 04/09/2017 (Approximate)   General:  {gen appearance:16600}   Breasts:  {breast exam:1202::"inspection negative, no nipple discharge or bleeding, no masses or nodularity palpable"}  Lungs: {lung exam:16931}  Heart:  {heart exam:5510}  Abdomen: {abdomen exam:16834}   Vulva:  {labia exam:12198}  Vagina: {vagina exam:12200}  Cervix:  {cervix exam:14595}  Corpus: {uterus exam:12215}  Adnexa:  {adnexa exam:12223}  Rectal Exam: {rectal/vaginal exam:12274}        Assessment:    *** postpartum exam. Pap smear {done:10129} at today's visit.   Plan:    1. Contraception: {method:5051} 2. *** 3. Follow up in: {1-10:13787} {time; units:19136} or as needed.

## 2018-02-04 LAB — CBC
Hematocrit: 36.6 % (ref 34.0–46.6)
Hemoglobin: 11.9 g/dL (ref 11.1–15.9)
MCH: 27.5 pg (ref 26.6–33.0)
MCHC: 32.5 g/dL (ref 31.5–35.7)
MCV: 85 fL (ref 79–97)
PLATELETS: 339 10*3/uL (ref 150–379)
RBC: 4.32 x10E6/uL (ref 3.77–5.28)
RDW: 14.4 % (ref 12.3–15.4)
WBC: 6.9 10*3/uL (ref 3.4–10.8)

## 2018-10-05 ENCOUNTER — Encounter (HOSPITAL_COMMUNITY): Payer: Self-pay | Admitting: Emergency Medicine

## 2018-10-05 ENCOUNTER — Other Ambulatory Visit: Payer: Self-pay

## 2018-10-05 ENCOUNTER — Emergency Department (HOSPITAL_COMMUNITY)
Admission: EM | Admit: 2018-10-05 | Discharge: 2018-10-05 | Disposition: A | Discharge status: Home / Self Care | Payer: Medicaid Other | Attending: Emergency Medicine | Admitting: Emergency Medicine

## 2018-10-05 DIAGNOSIS — Y999 Unspecified external cause status: Secondary | ICD-10-CM | POA: Insufficient documentation

## 2018-10-05 DIAGNOSIS — Y9389 Activity, other specified: Secondary | ICD-10-CM | POA: Diagnosis not present

## 2018-10-05 DIAGNOSIS — Y92000 Kitchen of unspecified non-institutional (private) residence as  the place of occurrence of the external cause: Secondary | ICD-10-CM | POA: Diagnosis not present

## 2018-10-05 DIAGNOSIS — T23212A Burn of second degree of left thumb (nail), initial encounter: Secondary | ICD-10-CM | POA: Diagnosis not present

## 2018-10-05 DIAGNOSIS — T2016XA Burn of first degree of forehead and cheek, initial encounter: Secondary | ICD-10-CM | POA: Insufficient documentation

## 2018-10-05 DIAGNOSIS — Z79899 Other long term (current) drug therapy: Secondary | ICD-10-CM | POA: Diagnosis not present

## 2018-10-05 DIAGNOSIS — T24111A Burn of first degree of right thigh, initial encounter: Secondary | ICD-10-CM | POA: Insufficient documentation

## 2018-10-05 DIAGNOSIS — T22232A Burn of second degree of left upper arm, initial encounter: Secondary | ICD-10-CM | POA: Diagnosis not present

## 2018-10-05 DIAGNOSIS — T2029XA Burn of second degree of multiple sites of head, face, and neck, initial encounter: Secondary | ICD-10-CM | POA: Insufficient documentation

## 2018-10-05 DIAGNOSIS — T24112A Burn of first degree of left thigh, initial encounter: Secondary | ICD-10-CM | POA: Diagnosis not present

## 2018-10-05 DIAGNOSIS — T23272A Burn of second degree of left wrist, initial encounter: Secondary | ICD-10-CM | POA: Insufficient documentation

## 2018-10-05 DIAGNOSIS — S4992XA Unspecified injury of left shoulder and upper arm, initial encounter: Secondary | ICD-10-CM | POA: Diagnosis present

## 2018-10-05 DIAGNOSIS — Z23 Encounter for immunization: Secondary | ICD-10-CM | POA: Diagnosis not present

## 2018-10-05 DIAGNOSIS — X102XXA Contact with fats and cooking oils, initial encounter: Secondary | ICD-10-CM | POA: Diagnosis not present

## 2018-10-05 DIAGNOSIS — T300 Burn of unspecified body region, unspecified degree: Secondary | ICD-10-CM

## 2018-10-05 LAB — BASIC METABOLIC PANEL
ANION GAP: 10 (ref 5–15)
BUN: 7 mg/dL (ref 6–20)
CO2: 22 mmol/L (ref 22–32)
Calcium: 8.9 mg/dL (ref 8.9–10.3)
Chloride: 105 mmol/L (ref 98–111)
Creatinine, Ser: 0.73 mg/dL (ref 0.44–1.00)
GFR calc Af Amer: >60 mL/min (ref >60)
GFR calc non Af Amer: >60 mL/min (ref >60)
Glucose, Bld: 102 mg/dL — ABNORMAL HIGH (ref 70–99)
POTASSIUM: 4.1 mmol/L (ref 3.5–5.1)
Sodium: 137 mmol/L (ref 135–145)

## 2018-10-05 LAB — CBC WITH DIFFERENTIAL/PLATELET
Abs Immature Granulocytes: 0.05 10*3/uL (ref 0.00–0.07)
Basophils Absolute: 0 10*3/uL (ref 0.0–0.1)
Basophils Relative: 0 %
Eosinophils Absolute: 0 10*3/uL (ref 0.0–0.5)
Eosinophils Relative: 0 %
HCT: 41.3 % (ref 36.0–46.0)
Hemoglobin: 13.4 g/dL (ref 12.0–15.0)
IMMATURE GRANULOCYTES: 0 %
Lymphocytes Relative: 19 %
Lymphs Abs: 2.1 10*3/uL (ref 0.7–4.0)
MCH: 28.6 pg (ref 26.0–34.0)
MCHC: 32.4 g/dL (ref 30.0–36.0)
MCV: 88.1 fL (ref 80.0–100.0)
Monocytes Absolute: 0.9 10*3/uL (ref 0.1–1.0)
Monocytes Relative: 8 %
Neutro Abs: 8.4 10*3/uL — ABNORMAL HIGH (ref 1.7–7.7)
Neutrophils Relative %: 73 %
Platelets: 370 10*3/uL (ref 150–400)
RBC: 4.69 MIL/uL (ref 3.87–5.11)
RDW: 13.5 % (ref 11.5–15.5)
WBC: 11.5 10*3/uL — ABNORMAL HIGH (ref 4.0–10.5)
nRBC: 0 % (ref 0.0–0.2)

## 2018-10-05 MED ORDER — BACITRACIN-NEOMYCIN-POLYMYXIN OINTMENT TUBE
TOPICAL_OINTMENT | CUTANEOUS | Status: DC | PRN
  Administered 2018-10-05: 09:00:00 via TOPICAL
  Filled 2018-10-05: qty 14.17

## 2018-10-05 MED ORDER — TETANUS-DIPHTH-ACELL PERTUSSIS 5-2.5-18.5 LF-MCG/0.5 IM SUSP
0.5000 mL | Freq: Once | INTRAMUSCULAR | Status: AC
  Administered 2018-10-05: 0.5 mL via INTRAMUSCULAR
  Filled 2018-10-05: qty 0.5

## 2018-10-05 MED ORDER — OXYCODONE-ACETAMINOPHEN 5-325 MG PO TABS: 1.0000 | ORAL_TABLET | ORAL | 0 refills | Status: DC | PRN

## 2018-10-05 MED ORDER — SILVER SULFADIAZINE 1 % EX CREA
TOPICAL_CREAM | Freq: Once | CUTANEOUS | Status: AC
  Administered 2018-10-05: 08:00:00 via TOPICAL
  Filled 2018-10-05: qty 50

## 2018-10-05 MED ORDER — MORPHINE SULFATE (PF) 4 MG/ML IV SOLN
4.0000 mg | Freq: Once | INTRAVENOUS | Status: AC
  Administered 2018-10-05: 4 mg via INTRAVENOUS
  Filled 2018-10-05: qty 1

## 2018-10-05 MED ORDER — ONDANSETRON HCL 4 MG/2ML IJ SOLN
4.0000 mg | Freq: Once | INTRAMUSCULAR | Status: AC
  Administered 2018-10-05: 4 mg via INTRAVENOUS
  Filled 2018-10-05: qty 2

## 2018-10-05 MED ORDER — SODIUM CHLORIDE 0.9 % IV BOLUS
1000.0000 mL | Freq: Once | INTRAVENOUS | Status: AC
  Administered 2018-10-05: 1000 mL via INTRAVENOUS

## 2018-10-05 NOTE — Discharge Instructions (Signed)
I [facility].  Recheck on Saturday.  Daily dressing change.  Neosporin ointment.  Pain medication.
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
## 2022-04-30 ENCOUNTER — Emergency Department (HOSPITAL_COMMUNITY): Payer: Medicaid Other

## 2022-04-30 ENCOUNTER — Other Ambulatory Visit: Payer: Self-pay

## 2022-04-30 ENCOUNTER — Emergency Department (HOSPITAL_COMMUNITY)
Admission: EM | Admit: 2022-04-30 | Discharge: 2022-04-30 | Disposition: A | Discharge status: Home / Self Care | Payer: Medicaid Other | Attending: Emergency Medicine | Admitting: Emergency Medicine

## 2022-04-30 DIAGNOSIS — R32 Unspecified urinary incontinence: Secondary | ICD-10-CM | POA: Diagnosis not present

## 2022-04-30 DIAGNOSIS — R079 Chest pain, unspecified: Secondary | ICD-10-CM

## 2022-04-30 DIAGNOSIS — Z20822 Contact with and (suspected) exposure to covid-19: Secondary | ICD-10-CM | POA: Insufficient documentation

## 2022-04-30 DIAGNOSIS — F1490 Cocaine use, unspecified, uncomplicated: Secondary | ICD-10-CM | POA: Diagnosis not present

## 2022-04-30 DIAGNOSIS — R0789 Other chest pain: Secondary | ICD-10-CM | POA: Diagnosis not present

## 2022-04-30 DIAGNOSIS — R569 Unspecified convulsions: Secondary | ICD-10-CM | POA: Insufficient documentation

## 2022-04-30 LAB — URINALYSIS, ROUTINE W REFLEX MICROSCOPIC
Bilirubin Urine: NEGATIVE
Glucose, UA: NEGATIVE mg/dL
Ketones, ur: NEGATIVE mg/dL
Leukocytes,Ua: NEGATIVE
Nitrite: NEGATIVE
Protein, ur: 30 mg/dL — AB
Specific Gravity, Urine: 1.012 (ref 1.005–1.030)
pH: 5 (ref 5.0–8.0)

## 2022-04-30 LAB — CBC WITH DIFFERENTIAL/PLATELET
Abs Immature Granulocytes: 0.16 10*3/uL — ABNORMAL HIGH (ref 0.00–0.07)
Basophils Absolute: 0 10*3/uL (ref 0.0–0.1)
Basophils Relative: 0 %
Eosinophils Absolute: 0.1 10*3/uL (ref 0.0–0.5)
Eosinophils Relative: 1 %
HCT: 39.5 % (ref 36.0–46.0)
Hemoglobin: 13.1 g/dL (ref 12.0–15.0)
Immature Granulocytes: 2 %
Lymphocytes Relative: 15 %
Lymphs Abs: 1.5 10*3/uL (ref 0.7–4.0)
MCH: 29.4 pg (ref 26.0–34.0)
MCHC: 33.2 g/dL (ref 30.0–36.0)
MCV: 88.6 fL (ref 80.0–100.0)
Monocytes Absolute: 0.7 10*3/uL (ref 0.1–1.0)
Monocytes Relative: 7 %
Neutro Abs: 7.5 10*3/uL (ref 1.7–7.7)
Neutrophils Relative %: 75 %
Platelets: 307 10*3/uL (ref 150–400)
RBC: 4.46 MIL/uL (ref 3.87–5.11)
RDW: 13.3 % (ref 11.5–15.5)
WBC: 9.8 10*3/uL (ref 4.0–10.5)
nRBC: 0 % (ref 0.0–0.2)

## 2022-04-30 LAB — COMPREHENSIVE METABOLIC PANEL
ALT: 19 U/L (ref 0–44)
AST: 21 U/L (ref 15–41)
Albumin: 3.6 g/dL (ref 3.5–5.0)
Alkaline Phosphatase: 68 U/L (ref 38–126)
Anion gap: 10 (ref 5–15)
BUN: 6 mg/dL (ref 6–20)
CO2: 21 mmol/L — ABNORMAL LOW (ref 22–32)
Calcium: 8.9 mg/dL (ref 8.9–10.3)
Chloride: 104 mmol/L (ref 98–111)
Creatinine, Ser: 0.93 mg/dL (ref 0.44–1.00)
GFR, Estimated: >60 mL/min (ref >60)
Glucose, Bld: 118 mg/dL — ABNORMAL HIGH (ref 70–99)
Potassium: 4.5 mmol/L (ref 3.5–5.1)
Sodium: 135 mmol/L (ref 135–145)
Total Bilirubin: 0.3 mg/dL (ref 0.3–1.2)
Total Protein: 7.1 g/dL (ref 6.5–8.1)

## 2022-04-30 LAB — LACTIC ACID, PLASMA
Lactic Acid, Venous: 2.8 mmol/L (ref 0.5–1.9)
Lactic Acid, Venous: 1.3 mmol/L (ref 0.5–1.9)

## 2022-04-30 LAB — CBG MONITORING, ED: Glucose-Capillary: 102 mg/dL — ABNORMAL HIGH (ref 70–99)

## 2022-04-30 LAB — TROPONIN I (HIGH SENSITIVITY)
Troponin I (High Sensitivity): 5 ng/L (ref <18)
Troponin I (High Sensitivity): 20 ng/L — ABNORMAL HIGH (ref <18)

## 2022-04-30 LAB — RAPID URINE DRUG SCREEN, HOSP PERFORMED
Amphetamines: NOT DETECTED
Barbiturates: NOT DETECTED
Benzodiazepines: NOT DETECTED
Cocaine: POSITIVE — AB
Opiates: NOT DETECTED
Tetrahydrocannabinol: POSITIVE — AB

## 2022-04-30 LAB — I-STAT BETA HCG BLOOD, ED (MC, WL, AP ONLY): I-stat hCG, quantitative: <5 m[IU]/mL (ref <5)

## 2022-04-30 LAB — ETHANOL: Alcohol, Ethyl (B): <10 mg/dL (ref <10)

## 2022-04-30 LAB — MAGNESIUM: Magnesium: 2.1 mg/dL (ref 1.7–2.4)

## 2022-04-30 LAB — SARS CORONAVIRUS 2 BY RT PCR: SARS Coronavirus 2 by RT PCR: NEGATIVE

## 2022-04-30 MED ORDER — LACTATED RINGERS IV BOLUS
1000.0000 mL | Freq: Once | INTRAVENOUS | Status: AC
  Administered 2022-04-30: 1000 mL via INTRAVENOUS

## 2022-05-04 ENCOUNTER — Other Ambulatory Visit (HOSPITAL_COMMUNITY): Payer: Self-pay | Admitting: General Practice

## 2022-07-23 ENCOUNTER — Emergency Department (HOSPITAL_COMMUNITY)
Admission: EM | Admit: 2022-07-23 | Discharge: 2022-07-23 | Disposition: A | Discharge status: Home / Self Care | Payer: Medicaid Other | Attending: Emergency Medicine | Admitting: Emergency Medicine

## 2022-07-23 ENCOUNTER — Encounter (HOSPITAL_COMMUNITY): Payer: Self-pay

## 2022-07-23 DIAGNOSIS — R569 Unspecified convulsions: Secondary | ICD-10-CM

## 2022-07-23 DIAGNOSIS — F1490 Cocaine use, unspecified, uncomplicated: Secondary | ICD-10-CM | POA: Diagnosis not present

## 2022-07-23 LAB — CBC WITH DIFFERENTIAL/PLATELET
Abs Immature Granulocytes: 0.06 10*3/uL (ref 0.00–0.07)
Basophils Absolute: 0 10*3/uL (ref 0.0–0.1)
Basophils Relative: 0 %
Eosinophils Absolute: 0.1 10*3/uL (ref 0.0–0.5)
Eosinophils Relative: 1 %
HCT: 40.8 % (ref 36.0–46.0)
Hemoglobin: 13.5 g/dL (ref 12.0–15.0)
Immature Granulocytes: 1 %
Lymphocytes Relative: 18 %
Lymphs Abs: 1.7 10*3/uL (ref 0.7–4.0)
MCH: 29 pg (ref 26.0–34.0)
MCHC: 33.1 g/dL (ref 30.0–36.0)
MCV: 87.7 fL (ref 80.0–100.0)
Monocytes Absolute: 0.8 10*3/uL (ref 0.1–1.0)
Monocytes Relative: 9 %
Neutro Abs: 6.6 10*3/uL (ref 1.7–7.7)
Neutrophils Relative %: 71 %
Platelets: 325 10*3/uL (ref 150–400)
RBC: 4.65 MIL/uL (ref 3.87–5.11)
RDW: 13.2 % (ref 11.5–15.5)
WBC: 9.3 10*3/uL (ref 4.0–10.5)
nRBC: 0 % (ref 0.0–0.2)

## 2022-07-23 LAB — URINALYSIS, ROUTINE W REFLEX MICROSCOPIC
Bilirubin Urine: NEGATIVE
Glucose, UA: NEGATIVE mg/dL
Ketones, ur: NEGATIVE mg/dL
Leukocytes,Ua: NEGATIVE
Nitrite: NEGATIVE
Protein, ur: NEGATIVE mg/dL
Specific Gravity, Urine: 1.016 (ref 1.005–1.030)
pH: 6 (ref 5.0–8.0)

## 2022-07-23 LAB — COMPREHENSIVE METABOLIC PANEL
ALT: 16 U/L (ref 0–44)
AST: 18 U/L (ref 15–41)
Albumin: 4.1 g/dL (ref 3.5–5.0)
Alkaline Phosphatase: 70 U/L (ref 38–126)
Anion gap: 6 (ref 5–15)
BUN: 9 mg/dL (ref 6–20)
CO2: 25 mmol/L (ref 22–32)
Calcium: 9.4 mg/dL (ref 8.9–10.3)
Chloride: 108 mmol/L (ref 98–111)
Creatinine, Ser: 0.74 mg/dL (ref 0.44–1.00)
GFR, Estimated: >60 mL/min (ref >60)
Glucose, Bld: 120 mg/dL — ABNORMAL HIGH (ref 70–99)
Potassium: 4.9 mmol/L (ref 3.5–5.1)
Sodium: 139 mmol/L (ref 135–145)
Total Bilirubin: 0.3 mg/dL (ref 0.3–1.2)
Total Protein: 7.5 g/dL (ref 6.5–8.1)

## 2022-07-23 LAB — RAPID URINE DRUG SCREEN, HOSP PERFORMED
Amphetamines: NOT DETECTED
Barbiturates: NOT DETECTED
Benzodiazepines: NOT DETECTED
Cocaine: POSITIVE — AB
Opiates: NOT DETECTED
Tetrahydrocannabinol: POSITIVE — AB

## 2022-07-23 LAB — POC URINE PREG, ED: Preg Test, Ur: NEGATIVE

## 2022-07-23 LAB — MAGNESIUM: Magnesium: 2.1 mg/dL (ref 1.7–2.4)

## 2022-07-23 LAB — I-STAT BETA HCG BLOOD, ED (MC, WL, AP ONLY): I-stat hCG, quantitative: <5 m[IU]/mL (ref <5)

## 2022-07-23 LAB — TSH: TSH: 1.436 u[IU]/mL (ref 0.350–4.500)

## 2022-07-23 LAB — CBG MONITORING, ED: Glucose-Capillary: 125 mg/dL — ABNORMAL HIGH (ref 70–99)

## 2022-07-23 LAB — ETHANOL: Alcohol, Ethyl (B): <10 mg/dL (ref <10)

## 2022-07-23 MED ORDER — SODIUM CHLORIDE 0.9 % IV BOLUS
1000.0000 mL | Freq: Once | INTRAVENOUS | Status: AC
  Administered 2022-07-23: 1000 mL via INTRAVENOUS

## 2022-07-23 MED ORDER — LEVETIRACETAM IN NACL 1500 MG/100ML IV SOLN
1500.0000 mg | Freq: Once | INTRAVENOUS | Status: AC
  Administered 2022-07-23: 1500 mg via INTRAVENOUS
  Filled 2022-07-23: qty 100

## 2022-07-23 MED ORDER — LEVETIRACETAM IN NACL 500 MG/100ML IV SOLN
500.0000 mg | Freq: Once | INTRAVENOUS | Status: AC
  Administered 2022-07-23: 500 mg via INTRAVENOUS
  Filled 2022-07-23: qty 100

## 2022-07-23 NOTE — ED Notes (Signed)
Pt ambualted to [facility] w/o difficulty, steady gait noted, denies CP, weakness, SOB, or dizziness.

## 2022-07-23 NOTE — ED Notes (Signed)
Per [employer], hold Keppra until further notice

## 2022-07-23 NOTE — Discharge Instructions (Addendum)
Please follow-up with neurology for your seizures. Do not drive for 6 months or until you are given clearance by neurology, given the seizures.   Given your concern your cocaine exposure was from laced drug use, I recommend discontinuing all drug use at this time. Continue to monitor yourself for any symptoms until you have been several weeks to months seizure-free.  In the meantime please keep your follow-up appointment, and refrain from driving or operating heavy machinery.  I recommend that you drink plenty of water, get plenty of rest, try to eat a balanced healthy diet.  Please return to the emergency department if you have any concern for ongoing symptoms.

## 2022-07-23 NOTE — ED Triage Notes (Signed)
Pt from home, BIB [facility] for apparent seizures, witnessed by daughter. Pt was here in June for same, 1st time seizure, had full work-up, not prescribe any seizures meds, instructed to f/u with neuro, which pt has not. Pt denies cocaine use on this visit, uses cannabis. VSS, A&O x4.

## 2022-07-23 NOTE — ED Provider Notes (Signed)
[facility]-EMERGENCY DEPT Provider Note   CSN: [id number] Arrival date & time: 07/23/22  0441     History  Chief Complaint  Patient presents with   Seizures    Tracie Valenzuela is a 36 y.o. female, history of cocaine use, marijuana use, who presents to the ED secondary to being brought in by EMS for a seizure witnessed by her daughter.  She states that her daughter saw her shaking all over, and called an ambulance.  States that she was sleeping in her bed when the seizure happened.  Did not urinate, defecate on self, or bite tongue.  Denies any headache, nausea, vomiting.  Has not been ill recently.  States that she had a seizure in June, and was told to follow-up with a neurologist but never did.  Has only had 1 seizure in the past--which was in June.   Seizures      Home Medications Prior to Admission medications   Medication Sig Start Date End Date Taking? Authorizing Provider  norgestimate-ethinyl estradiol (ORTHO-CYCLEN,SPRINTEC,PREVIFEM) 0.25-35 MG-MCG tablet Take 1 tablet by mouth daily. Patient not taking: Reported on 10/05/2018 02/03/18   Marny Lowenstein, PA-C      Allergies    Patient has no known allergies.    Review of Systems   Review of Systems  Neurological:  Positive for seizures.    Physical Exam Updated Vital Signs BP (!) 115/91   Pulse 75   Temp 98.7 F (37.1 C) (Oral)   Resp 19   Ht 5\' 10"  (1.778 m)   Wt 99.8 kg   SpO2 99%   BMI 31.57 kg/m  Physical Exam Vitals and nursing note reviewed.  Constitutional:      Appearance: Normal appearance.  HENT:     Head: Normocephalic and atraumatic.     Right Ear: Tympanic membrane normal.     Left Ear: Tympanic membrane normal.     Nose: Nose normal.     Mouth/Throat:     Mouth: Mucous membranes are moist.  Eyes:     Extraocular Movements: Extraocular movements intact.     Conjunctiva/sclera: Conjunctivae normal.     Pupils: Pupils are equal, round, and reactive to light.   Cardiovascular:     Rate and Rhythm: Normal rate and regular rhythm.  Pulmonary:     Effort: Pulmonary effort is normal.     Breath sounds: Normal breath sounds.  Abdominal:     General: Abdomen is flat. Bowel sounds are normal.     Palpations: Abdomen is soft.  Musculoskeletal:        General: Normal range of motion.     Cervical back: Normal range of motion and neck supple.  Skin:    General: Skin is warm and dry.     Capillary Refill: Capillary refill takes less than 2 seconds.  Neurological:     General: No focal deficit present.     Mental Status: She is alert and oriented to person, place, and time.     Cranial Nerves: Cranial nerves 2-12 are intact.     Sensory: Sensation is intact.     Motor: Motor function is intact. No weakness.     Coordination: Coordination is intact. Finger-Nose-Finger Test and Heel to [facility] Test normal.  Psychiatric:        Mood and Affect: Mood normal.        Thought Content: Thought content normal.     ED Results / Procedures / Treatments   Labs (all labs  ordered are listed, but only abnormal results are displayed) Labs Reviewed  COMPREHENSIVE METABOLIC PANEL - Abnormal; Notable for the following components:      Result Value   Glucose, Bld 120 (*)    All other components within normal limits  CBG MONITORING, ED - Abnormal; Notable for the following components:   Glucose-Capillary 125 (*)    All other components within normal limits  CBC WITH DIFFERENTIAL/PLATELET  ETHANOL  TSH  MAGNESIUM  URINALYSIS, ROUTINE W REFLEX MICROSCOPIC  RAPID URINE DRUG SCREEN, HOSP PERFORMED  PROLACTIN  I-STAT BETA HCG BLOOD, ED ([facility], [facility], [facility] ONLY)  POC URINE PREG, ED    EKG EKG Interpretation  Date/Time:  Friday July 23 2022 04:58:54 EDT Ventricular Rate:  78 PR Interval:  161 QRS Duration: 88 QT Interval:  378 QTC Calculation: 431 R Axis:   77 Text Interpretation: Sinus rhythm Normal ECG Confirmed by Gilda Crease [phone]) on  07/23/2022 6:07:34 AM  Radiology No results found.  Procedures Procedures    Medications Ordered in ED Medications  levETIRAcetam (KEPPRA) IVPB 1500 mg/ 100 mL premix (has no administration in time range)  levETIRAcetam (KEPPRA) IVPB 500 mg/100 mL premix (500 mg Intravenous New Bag/Given 07/23/22 0558)  sodium chloride 0.9 % bolus 1,000 mL (1,000 mLs Intravenous New Bag/Given 07/23/22 8315)    ED Course/ Medical Decision Making/ A&P Clinical Course as of 07/23/22 0656  Fri Jul 23, 2022  0547 Comment 3:        [BS]  0548 I-stat hCG, quantitative: <5.0 [BS]  0633 35yo second time seizure, first was s/p cocaine use. Did not follow up with neuro last time. Waiting on urine and neuro consult. Keppra or other AE and dc [CP]  0653 If positive for cocaine, no keppra. If negative, keppra 500 bid, neuro referral, strict no driving six months [CP]    Clinical Course User Index [BS] Nivin Braniff, Harley Alto, PA [CP] Prosperi, Harrel Carina, PA-C                           Medical Decision Making Amount and/or Complexity of Data Reviewed Labs: ordered. Decision-making details documented in ED Course.  Risk Prescription drug management.  Patient is a 36 year old female, here for seizure, was witnessed by her daughter.  This is her second seizure ever.  Her last seizure appears to be under the influence of cocaine, she was not started any medications but advised to not drive for 6 more months encouraged to follow with neurology.  The patient did not follow-up with neurology.  She is at baseline now, does not have any memory deficits, states that occurred while she was sleeping.  Urine drug screen is pending at this time, consulted neurology Dr. Viviann Spare, he states that if cocaine is positive on UDS, do not start her on Keppra at this time, but tell her that she cannot drive for 6 more months, until cleared by neurology.  If UDS is negative for cocaine, he states start her on 500 mg twice daily of Keppra, and  again emphasized that she cannot drive for 6 more months till cleared by neurology.  He also recommends a 20 mg/kg loading dose.  At this time patient is waiting for urine to result, and for patient to have someone to pick her up.  She is at baseline at this time.  Care transferred to [facility], for urine results, and dispo. Final Clinical Impression(s) / ED Diagnoses Final diagnoses:  Seizure [facility])    Rx / DC Orders ED Discharge Orders          Ordered    Ambulatory referral to Neurology       Comments: An appointment is requested in approximately: 1 week   07/23/22 0652              Kym Scannell, Harley Alto, PA 07/23/22 5400    Gilda Crease, MD 07/23/22 [phone]

## 2022-07-23 NOTE — ED Provider Notes (Signed)
Accepted handoff at shift change from [facility]. Please see prior provider note for more detail.   Briefly: Patient is 36 y.o.   DDX: concern for 35yo second time seizure, first was s/p cocaine use. Did not follow up with neuro last time. Waiting on urine and neuro consult. Keppra or other AE and dc, If positive for cocaine, no keppra. If negative, keppra 500 bid, neuro referral, strict no driving six months  Plan: UA shows positive for cocaine and THC.  Patient is under the impression that her drugs are being what is causing her drug exposure.  Discussed that regardless of the cause it is likely that the drug use that is causing her seizures.  Recommend discontinuing all drug use at this time especially in context of seizures.  Loading dose increased to 2000 Keppra, which patient will finish prior to discharge, otherwise continued seizure precautions of no driving until cleared by neurology, patient to follow-up for neurology visit, discontinue drug use, and return if seizures return.  We will hold off on Keppra prescription for patient given presumed known cause.  Patient understands and agrees to plan, and is discharged in stable condition at this time.  She is back to neurologic baseline at time of discharge.      RISR  EDTHIS    Tracie Valenzuela 07/23/22 2595    Terrilee Files, MD 07/23/22 [phone]

## 2022-07-24 LAB — PROLACTIN: Prolactin: 9.2 ng/mL (ref 4.8–23.3)

## 2022-07-31 IMAGING — DX DG CHEST 1V PORT
1 series · 1 of 1 positions shown · non-contrast
Comparison: None Available.

CLINICAL DATA: Chest pain

EXAM:
PORTABLE CHEST 1 VIEW

[chest ap]
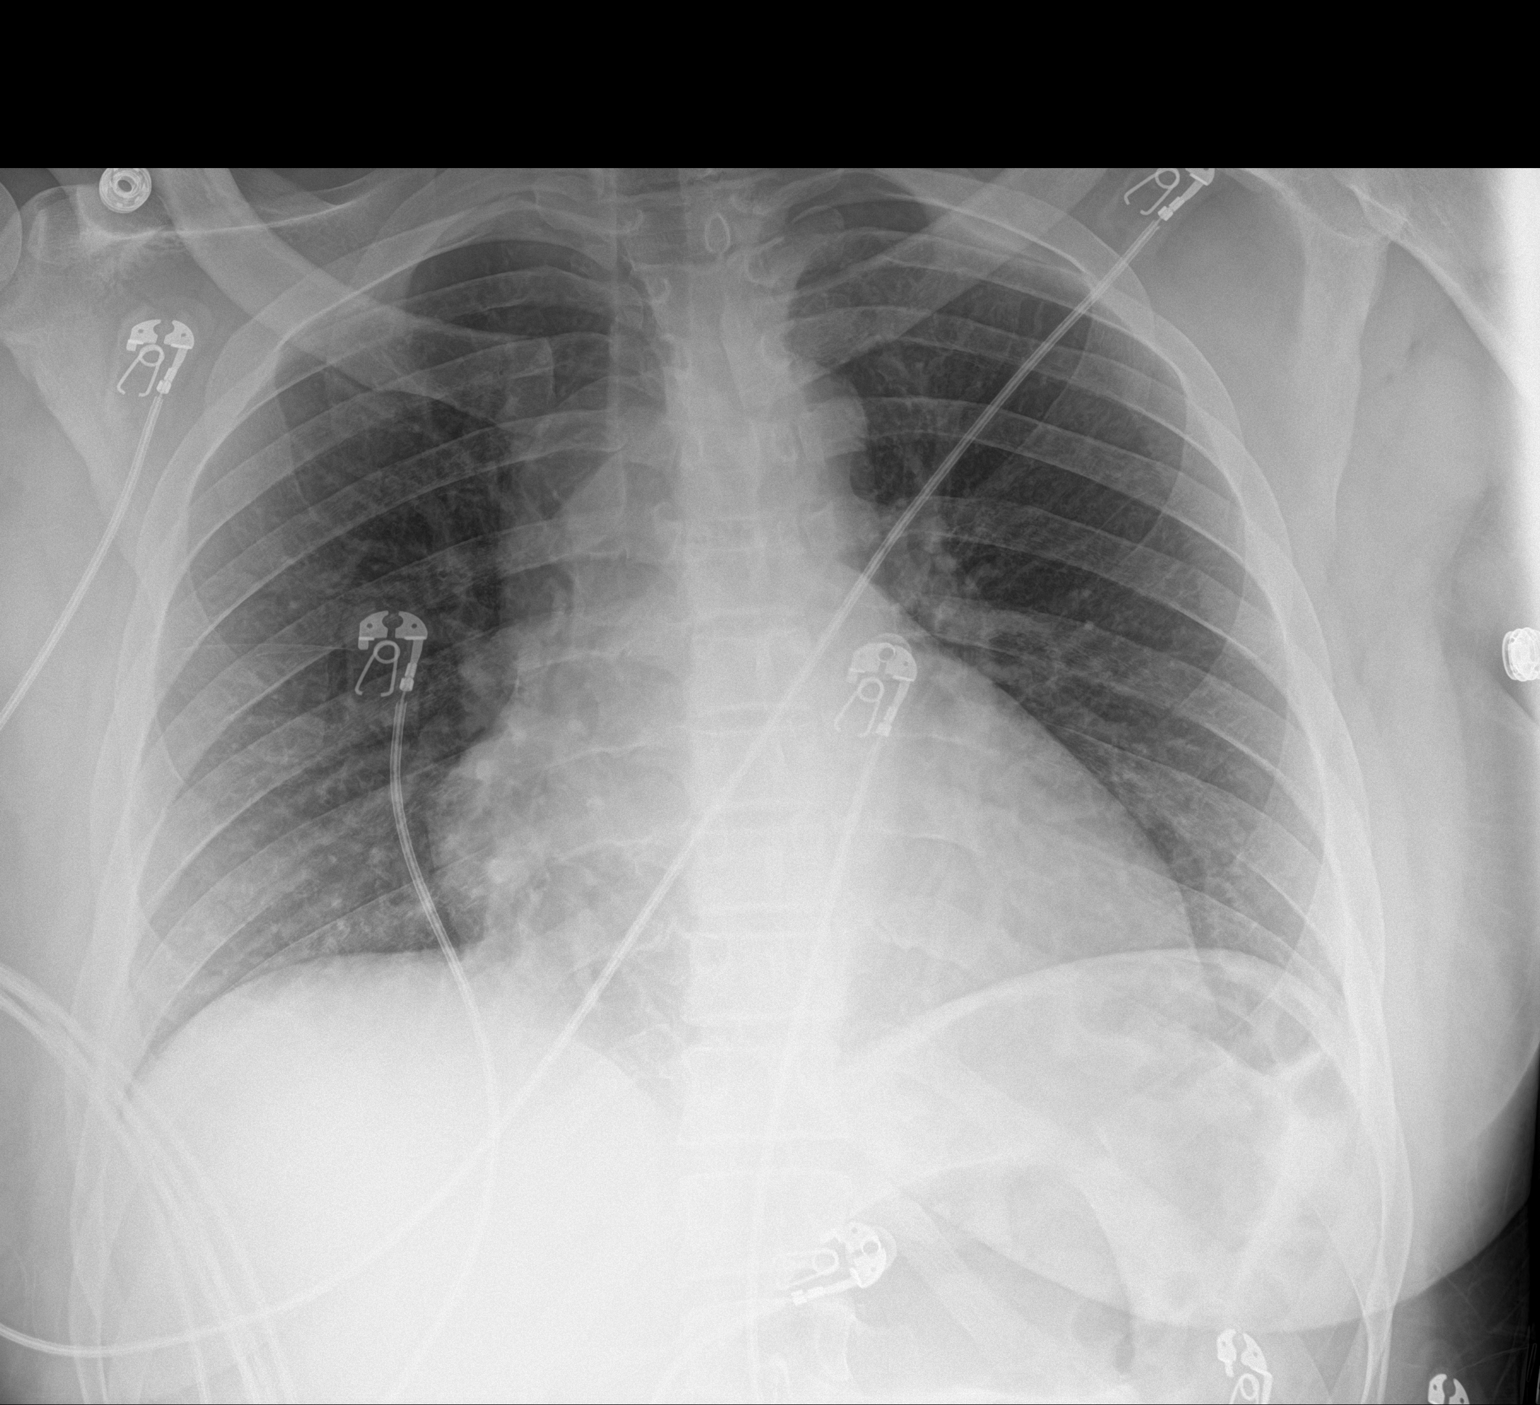

[1 of 1 positions shown; findings below may reference images not displayed]

FINDINGS: Enlargement of the cardiomediastinal silhouette exaggerated by
portable technique. Both lungs are clear. No pleural effusion or
pneumothorax. The visualized skeletal structures are unremarkable.
IMPRESSION: No acute process in the chest.

## 2022-07-31 IMAGING — CT CT HEAD W/O CM
4 series · 17 of 47 positions shown, 19 images · non-contrast
Comparison: None Available.

CLINICAL DATA: Seizure



[Series 3: head wo · axial · 0.43mm/px · z∈[-107,+13]mm · 7 of 32 slices shown, 9 images]
[im 4/32  brain]
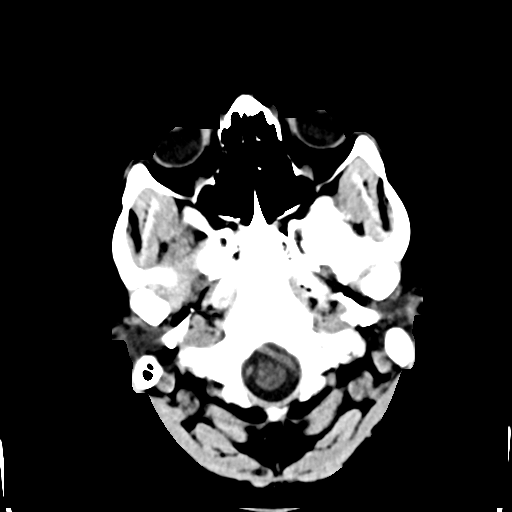
[im 4/32  bone]
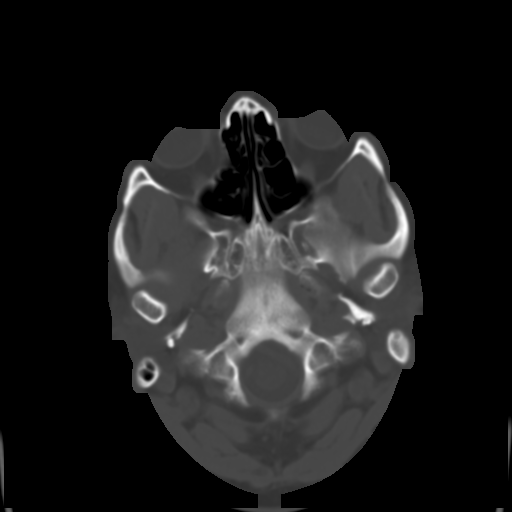
[im 8/32  brain]
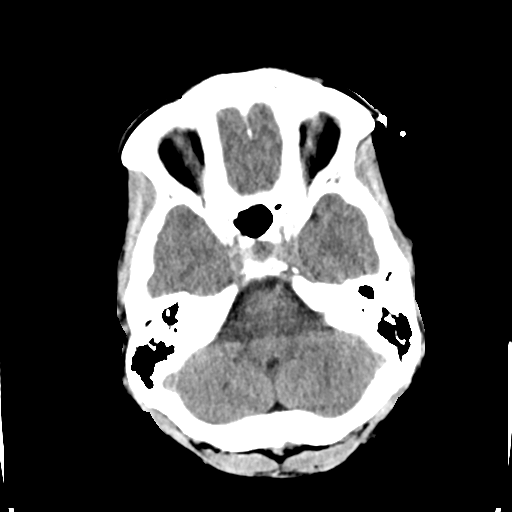
[im 12/32  brain]
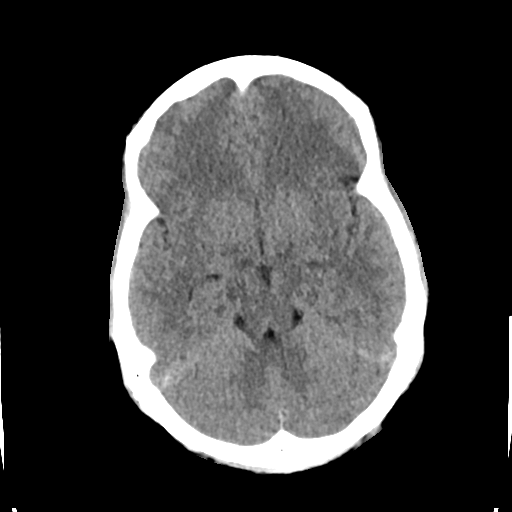
[im 16/32  brain]
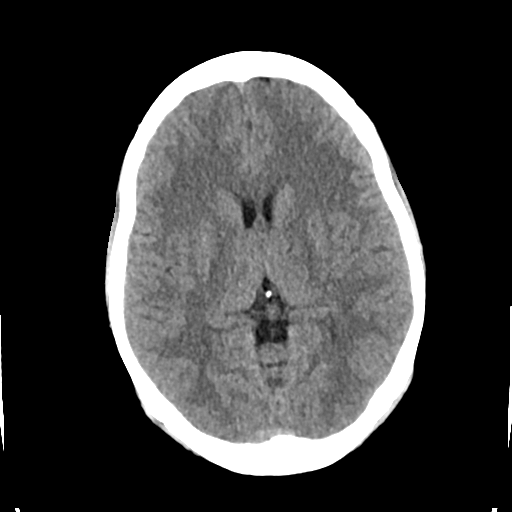
[im 20/32  brain]
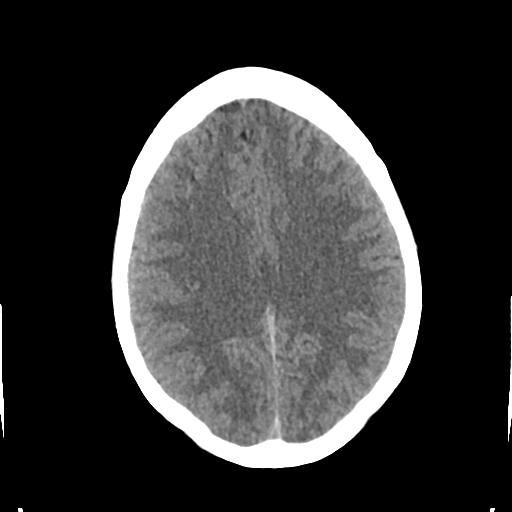
[im 20/32  bone]
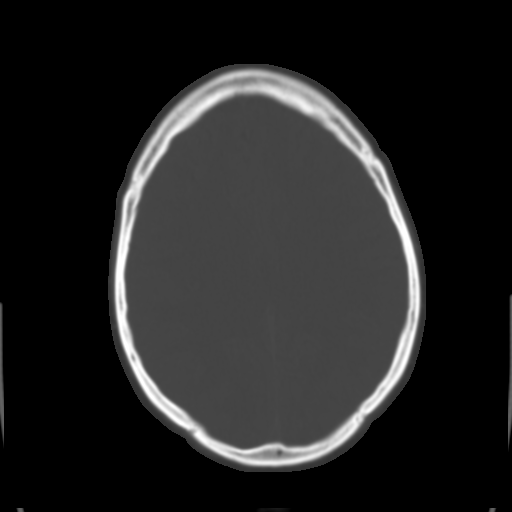
[im 24/32  brain]
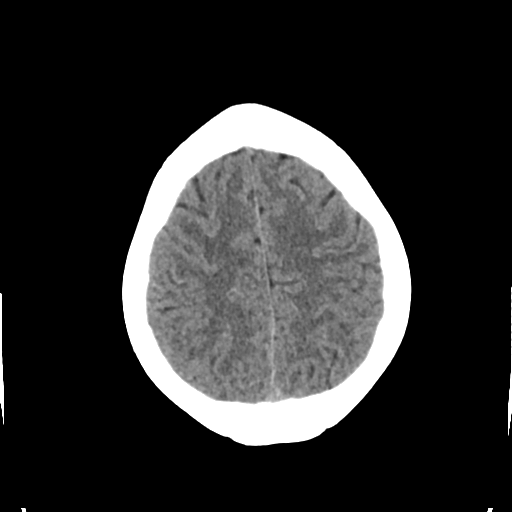
[im 28/32  brain]
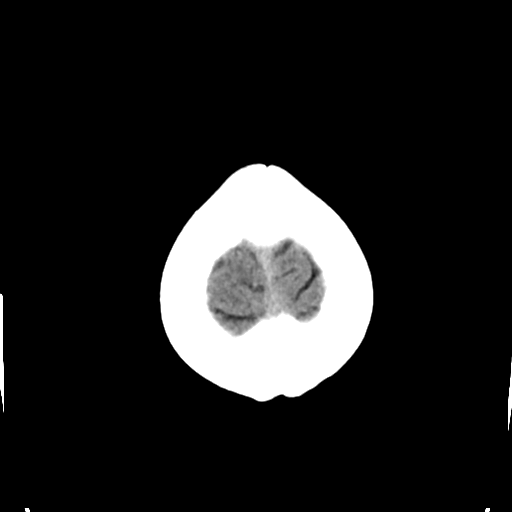

[Series 4: head bone · axial · 0.43mm/px · z∈[-108,-52]mm · 4 of 80 slices shown]
[im 8/80  bone]
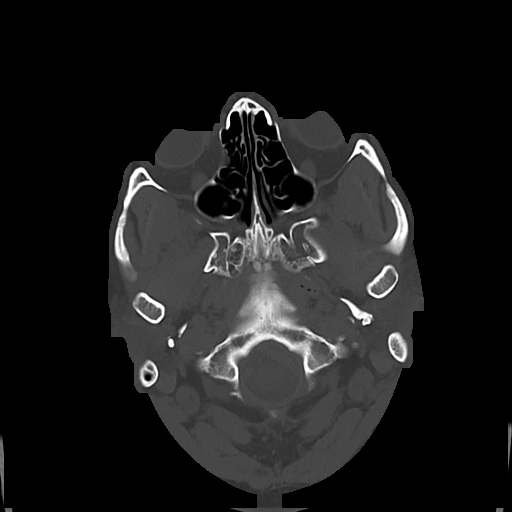
[im 16/80  bone]
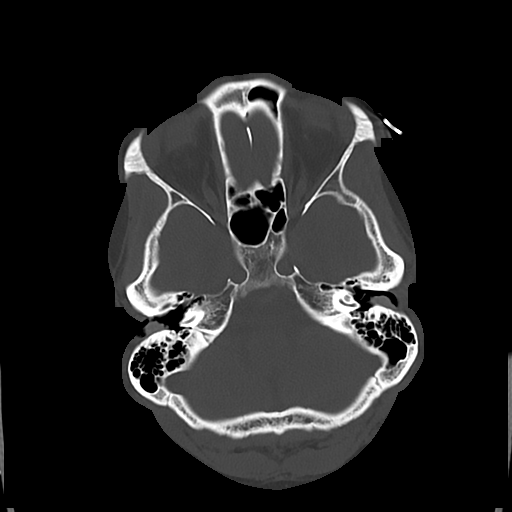
[im 24/80  bone]
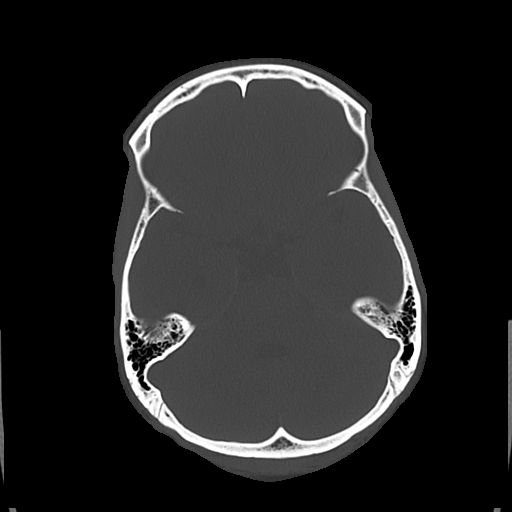
[im 36/80  bone]
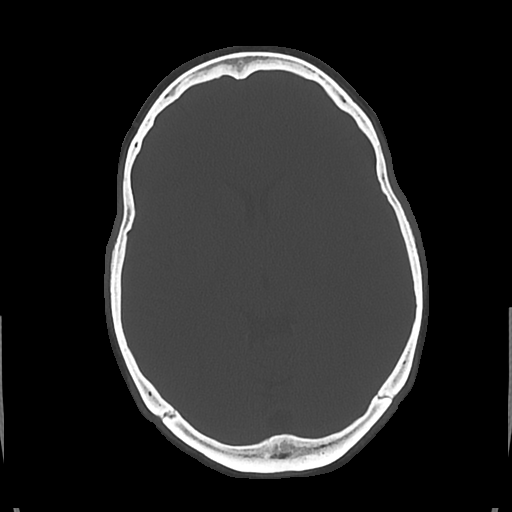

[Series 5: cor soft · coronal · 0.32mm/px · 3 of 69 slices shown]
[im 23/69  brain]
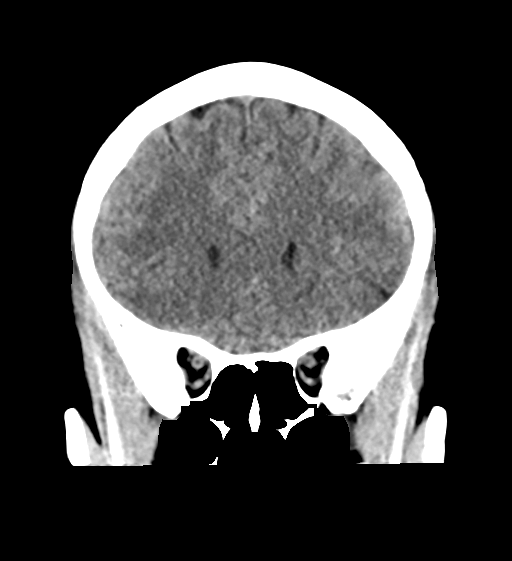
[im 31/69  brain]
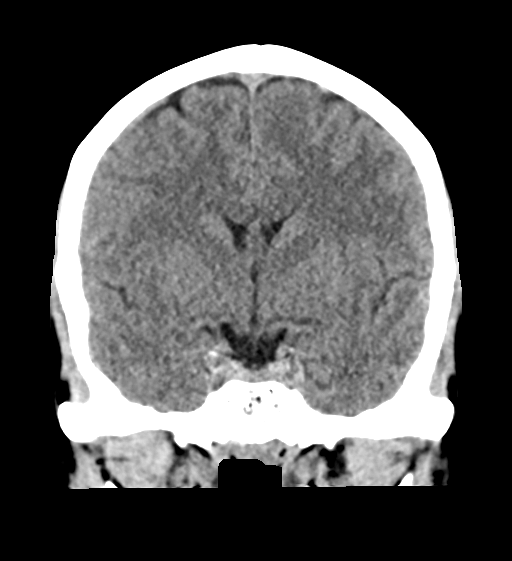
[im 38/69  brain]
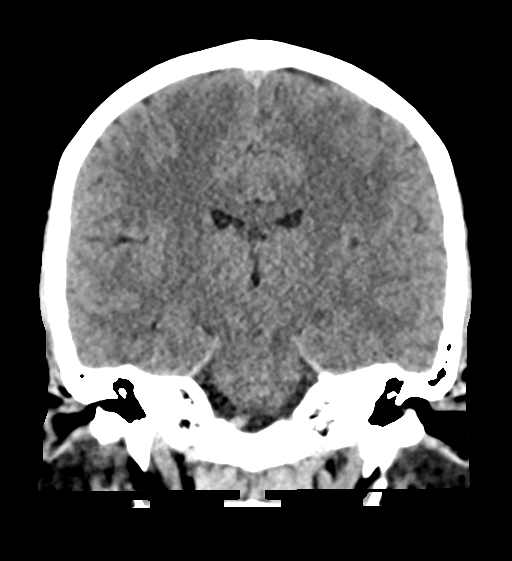

[Series 6: sag soft · sagittal · 0.35mm/px · 3 of 56 slices shown]
[im 19/56  brain]
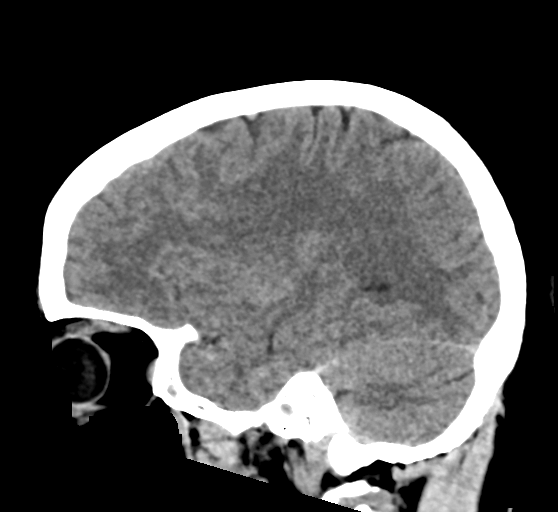
[im 28/56  brain]
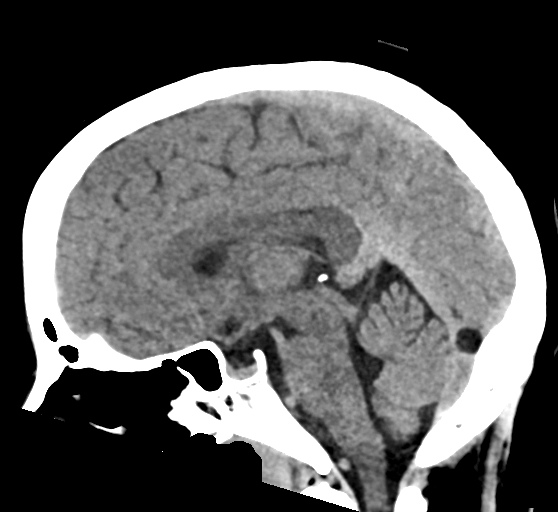
[im 37/56  brain]
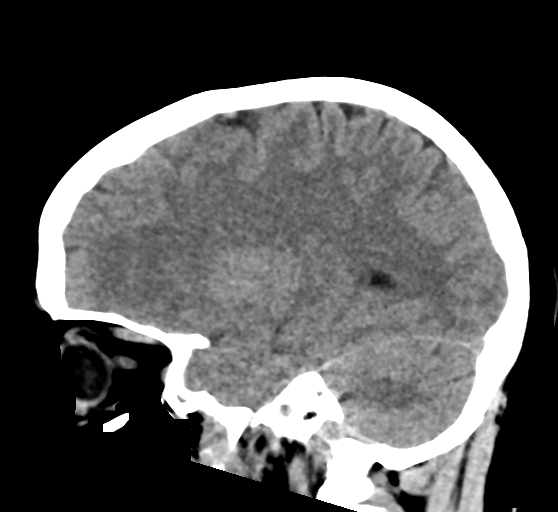

[17 of 47 positions shown; findings below may reference images not displayed]

FINDINGS: Brain: No acute intracranial hemorrhage, mass effect, or herniation.
No extra-axial fluid collections. No evidence of acute territorial
infarct. No hydrocephalus.

Vascular: No hyperdense vessel or unexpected calcification.

Skull: Normal. Negative for fracture or focal lesion.

Sinuses/Orbits: No acute finding.

Other: None.
IMPRESSION: No acute intracranial process identified.

## 2022-08-30 ENCOUNTER — Ambulatory Visit: Payer: Medicaid Other | Admitting: Neurology

## 2022-08-30 ENCOUNTER — Encounter: Payer: Self-pay | Admitting: Neurology

## 2022-12-29 ENCOUNTER — Emergency Department (HOSPITAL_COMMUNITY)
Admission: EM | Admit: 2022-12-29 | Discharge: 2022-12-30 | Disposition: A | Discharge status: Home / Self Care | Payer: Medicaid Other | Attending: Emergency Medicine | Admitting: Emergency Medicine

## 2022-12-29 ENCOUNTER — Other Ambulatory Visit: Payer: Self-pay

## 2022-12-29 DIAGNOSIS — G40909 Epilepsy, unspecified, not intractable, without status epilepticus: Secondary | ICD-10-CM | POA: Diagnosis not present

## 2022-12-29 DIAGNOSIS — R251 Tremor, unspecified: Secondary | ICD-10-CM | POA: Insufficient documentation

## 2022-12-29 DIAGNOSIS — R569 Unspecified convulsions: Secondary | ICD-10-CM | POA: Diagnosis present

## 2022-12-29 LAB — COMPREHENSIVE METABOLIC PANEL
ALT: 11 U/L (ref 0–44)
AST: 18 U/L (ref 15–41)
Albumin: 3.2 g/dL — ABNORMAL LOW (ref 3.5–5.0)
Alkaline Phosphatase: 47 U/L (ref 38–126)
Anion gap: 10 (ref 5–15)
BUN: 7 mg/dL (ref 6–20)
CO2: 21 mmol/L — ABNORMAL LOW (ref 22–32)
Calcium: 8.6 mg/dL — ABNORMAL LOW (ref 8.9–10.3)
Chloride: 104 mmol/L (ref 98–111)
Creatinine, Ser: 0.94 mg/dL (ref 0.44–1.00)
GFR, Estimated: >60 mL/min (ref >60)
Glucose, Bld: 102 mg/dL — ABNORMAL HIGH (ref 70–99)
Potassium: 4.2 mmol/L (ref 3.5–5.1)
Sodium: 135 mmol/L (ref 135–145)
Total Bilirubin: 0.3 mg/dL (ref 0.3–1.2)
Total Protein: 6 g/dL — ABNORMAL LOW (ref 6.5–8.1)

## 2022-12-29 LAB — CBC WITH DIFFERENTIAL/PLATELET
Abs Immature Granulocytes: 0.07 10*3/uL (ref 0.00–0.07)
Basophils Absolute: 0 10*3/uL (ref 0.0–0.1)
Basophils Relative: 0 %
Eosinophils Absolute: 0.1 10*3/uL (ref 0.0–0.5)
Eosinophils Relative: 1 %
HCT: 37.3 % (ref 36.0–46.0)
Hemoglobin: 12.1 g/dL (ref 12.0–15.0)
Immature Granulocytes: 1 %
Lymphocytes Relative: 19 %
Lymphs Abs: 2.4 10*3/uL (ref 0.7–4.0)
MCH: 28.7 pg (ref 26.0–34.0)
MCHC: 32.4 g/dL (ref 30.0–36.0)
MCV: 88.4 fL (ref 80.0–100.0)
Monocytes Absolute: 1.1 10*3/uL — ABNORMAL HIGH (ref 0.1–1.0)
Monocytes Relative: 9 %
Neutro Abs: 9.1 10*3/uL — ABNORMAL HIGH (ref 1.7–7.7)
Neutrophils Relative %: 70 %
Platelets: 281 10*3/uL (ref 150–400)
RBC: 4.22 MIL/uL (ref 3.87–5.11)
RDW: 13.5 % (ref 11.5–15.5)
WBC: 12.8 10*3/uL — ABNORMAL HIGH (ref 4.0–10.5)
nRBC: 0 % (ref 0.0–0.2)

## 2022-12-29 LAB — I-STAT BETA HCG BLOOD, ED (MC, WL, AP ONLY): I-stat hCG, quantitative: 5.6 m[IU]/mL — ABNORMAL HIGH (ref <5)

## 2022-12-29 MED ORDER — ACETAMINOPHEN 325 MG PO TABS
650.0000 mg | ORAL_TABLET | Freq: Once | ORAL | Status: AC
  Administered 2022-12-29: 650 mg via ORAL
  Filled 2022-12-29: qty 2

## 2022-12-29 MED ORDER — LEVETIRACETAM IN NACL 1000 MG/100ML IV SOLN
1000.0000 mg | Freq: Once | INTRAVENOUS | Status: AC
  Administered 2022-12-29: 1000 mg via INTRAVENOUS
  Filled 2022-12-29: qty 100

## 2022-12-29 NOTE — ED Notes (Signed)
Pt complaining of severe headache. Tylenol given

## 2022-12-29 NOTE — ED Notes (Signed)
During assessment, pt states that she was recently pregnant and took "abortion pill". She states that this was approx 3 weeks ago and she was [redacted] weeks gestation at that time. Will make provider aware.

## 2022-12-29 NOTE — ED Notes (Signed)
Pt's boyfriend is at bedside. Stretcher siderails up and pt encouraged to use call bell. Pt's door remains open for closer observation

## 2022-12-29 NOTE — ED Triage Notes (Signed)
Pt had witnessed "full-body" seizure x 1 hour ago. Pt was postictal upon EMS arrival but is now alert and oriented x 4 per baseline. Pt woke upi earlier today from nap and was incontinent of urine and wonders if she had seizure then as well. Pt has only had 2 seizures prios with the last one being in Nov. She is not prescribed any medication at this time and waas suppose to follow up with neurology, but unable to do so. Pt does endorse increased stress recently

## 2022-12-30 LAB — RAPID URINE DRUG SCREEN, HOSP PERFORMED
Amphetamines: NOT DETECTED
Barbiturates: NOT DETECTED
Benzodiazepines: NOT DETECTED
Cocaine: POSITIVE — AB
Opiates: NOT DETECTED
Tetrahydrocannabinol: POSITIVE — AB

## 2022-12-30 LAB — HCG, QUANTITATIVE, PREGNANCY: hCG, Beta Chain, Quant, S: 5 m[IU]/mL — ABNORMAL HIGH (ref <5)

## 2022-12-30 MED ORDER — LEVETIRACETAM 500 MG PO TABS: 500.0000 mg | ORAL_TABLET | Freq: Two times a day (BID) | ORAL | 0 refills | Status: DC

## 2022-12-30 NOTE — ED Provider Notes (Signed)
[place] AT [facility] Provider Note   CSN: [id number] Arrival date & time: 12/29/22  2134     History  Chief Complaint  Patient presents with   Seizures    Tracie Valenzuela is a 37 y.o. female.   Seizures Seizure activity on arrival: no   Seizure type:  Grand mal Episode characteristics: generalized shaking, incontinence and unresponsiveness   Return to baseline: yes   Severity:  Moderate Context: not pregnant and not previous head injury   Context comment:  Was told on previous visits to follow up with neurology and has not Has had episodes in the past year and was told to follow up with neurology but has not.  Did not fall or hit head today.  No fevers, no focal neuro deficits      Home Medications Prior to Admission medications   Medication Sig Start Date End Date Taking? Authorizing Provider  norgestimate-ethinyl estradiol (ORTHO-CYCLEN,SPRINTEC,PREVIFEM) 0.25-35 MG-MCG tablet Take 1 tablet by mouth daily. Patient not taking: Reported on 10/05/2018 02/03/18   Luvenia Redden, PA-C      Allergies    Patient has no known allergies.    Review of Systems   Review of Systems  Constitutional:  Negative for fever.  HENT:  Negative for facial swelling.   Eyes:  Negative for photophobia.  Respiratory:  Negative for shortness of breath, wheezing and stridor.   Gastrointestinal:  Negative for nausea and vomiting.  Neurological:  Positive for seizures.  All other systems reviewed and are negative.   Physical Exam Updated Vital Signs BP 126/80   Pulse 74   Temp 98 F (36.7 C) (Oral)   Resp 16   Ht 5' 10"$  (1.778 m)   Wt 88.9 kg   LMP  (LMP Unknown)   SpO2 98%   BMI 28.12 kg/m  Physical Exam Constitutional:      General: She is not in acute distress.    Appearance: Normal appearance. She is well-developed.  HENT:     Head: Normocephalic and atraumatic.     Nose: Nose normal.  Eyes:     Pupils: Pupils are equal, round, and  reactive to light.  Cardiovascular:     Rate and Rhythm: Normal rate and regular rhythm.     Pulses: Normal pulses.     Heart sounds: Normal heart sounds.  Pulmonary:     Effort: Pulmonary effort is normal. No respiratory distress.     Breath sounds: Normal breath sounds.  Abdominal:     General: Bowel sounds are normal. There is no distension.     Palpations: Abdomen is soft.     Tenderness: There is no abdominal tenderness. There is no guarding or rebound.  Genitourinary:    Vagina: No vaginal discharge.  Musculoskeletal:        General: Normal range of motion.     Cervical back: Neck supple.  Skin:    General: Skin is warm and dry.     Capillary Refill: Capillary refill takes less than 2 seconds.     Findings: No erythema or rash.  Neurological:     General: No focal deficit present.     Mental Status: She is alert and oriented to person, place, and time.     Deep Tendon Reflexes: Reflexes normal.  Psychiatric:        Mood and Affect: Mood normal.        Behavior: Behavior normal.     ED Results / Procedures /  Treatments   Labs (all labs ordered are listed, but only abnormal results are displayed) Results for orders placed or performed during the hospital encounter of 12/29/22  Comprehensive metabolic panel  Result Value Ref Range   Sodium 135 135 - 145 mmol/L   Potassium 4.2 3.5 - 5.1 mmol/L   Chloride 104 98 - 111 mmol/L   CO2 21 (L) 22 - 32 mmol/L   Glucose, Bld 102 (H) 70 - 99 mg/dL   BUN 7 6 - 20 mg/dL   Creatinine, Ser 0.94 0.44 - 1.00 mg/dL   Calcium 8.6 (L) 8.9 - 10.3 mg/dL   Total Protein 6.0 (L) 6.5 - 8.1 g/dL   Albumin 3.2 (L) 3.5 - 5.0 g/dL   AST 18 15 - 41 U/L   ALT 11 0 - 44 U/L   Alkaline Phosphatase 47 38 - 126 U/L   Total Bilirubin 0.3 0.3 - 1.2 mg/dL   GFR, Estimated >60 >60 mL/min   Anion gap 10 5 - 15  CBC with Differential/Platelet  Result Value Ref Range   WBC 12.8 (H) 4.0 - 10.5 K/uL   RBC 4.22 3.87 - 5.11 MIL/uL   Hemoglobin 12.1  12.0 - 15.0 g/dL   HCT 37.3 36.0 - 46.0 %   MCV 88.4 80.0 - 100.0 fL   MCH 28.7 26.0 - 34.0 pg   MCHC 32.4 30.0 - 36.0 g/dL   RDW 13.5 11.5 - 15.5 %   Platelets 281 150 - 400 K/uL   nRBC 0.0 0.0 - 0.2 %   Neutrophils Relative % 70 %   Neutro Abs 9.1 (H) 1.7 - 7.7 K/uL   Lymphocytes Relative 19 %   Lymphs Abs 2.4 0.7 - 4.0 K/uL   Monocytes Relative 9 %   Monocytes Absolute 1.1 (H) 0.1 - 1.0 K/uL   Eosinophils Relative 1 %   Eosinophils Absolute 0.1 0.0 - 0.5 K/uL   Basophils Relative 0 %   Basophils Absolute 0.0 0.0 - 0.1 K/uL   Immature Granulocytes 1 %   Abs Immature Granulocytes 0.07 0.00 - 0.07 K/uL  hCG, quantitative, pregnancy  Result Value Ref Range   hCG, Beta Chain, Quant, S 5 (H) <5 mIU/mL  Rapid urine drug screen (hospital performed)  Result Value Ref Range   Opiates NONE DETECTED NONE DETECTED   Cocaine POSITIVE (A) NONE DETECTED   Benzodiazepines NONE DETECTED NONE DETECTED   Amphetamines NONE DETECTED NONE DETECTED   Tetrahydrocannabinol POSITIVE (A) NONE DETECTED   Barbiturates NONE DETECTED NONE DETECTED  I-Stat beta hCG blood, ED  Result Value Ref Range   I-stat hCG, quantitative 5.6 (H) <5 mIU/mL   Comment 3           No results found.   Radiology No results found.  Procedures Procedures    Medications Ordered in ED Medications  acetaminophen (TYLENOL) tablet 650 mg (650 mg Oral Given 12/29/22 2211)  levETIRAcetam (KEPPRA) IVPB 1000 mg/100 mL premix (0 mg Intravenous Stopped 12/30/22 0036)    ED Course/ Medical Decision Making/ A&P                             Medical Decision Making Patient with seizures in the past few months and then had 2 today  Amount and/or Complexity of Data Reviewed Independent Historian: friend    Details: See above  External Data Reviewed: labs and notes.    Details: Previous notes and labs reviewed  Labs: ordered.  Details: HCG 5 (termination 3 weeks ago). UDS positive for cocaine and marijuana.   White count  slight elevation 12.8, normal hemoglobin 12.1, normal platelets 281 normal sodium 135, normal potassium 4.2 normal creatinine   Risk OTC drugs. Prescription drug management. Risk Details: Recently had an elective termination (3 weeks ago) this explains elevated HCG.  I have advised against alcohol and reaction drugs.  I have informed the patient verbally and in writing no driving for 6 months or until cleared by neurology. Patient verbalizes understanding and agrees to follow up.    I have initiated keppra.  No further seizures in the department.  Stable for discharge with close neuro follow up.  Strict return     Final Clinical Impression(s) / ED Diagnoses Final diagnoses:  Seizure disorder ([place])    Return for intractable cough, coughing up blood, fevers > 100.4 unrelieved by medication, shortness of breath, intractable vomiting, chest pain, shortness of breath, weakness, numbness, changes in speech, facial asymmetry, abdominal pain, passing out, Inability to tolerate liquids or food, cough, altered mental status or any concerns. No signs of systemic illness or infection. The patient is nontoxic-appearing on exam and vital signs are within normal limits.  I have reviewed the triage vital signs and the nursing notes. Pertinent labs & imaging results that were available during my care of the patient were reviewed by me and considered in my medical decision making (see chart for details). After history, exam, and medical workup I feel the patient has been appropriately medically screened and is safe for discharge home. Pertinent diagnoses were discussed with the patient. Patient was given return precautions.  Rx / DC Orders ED Discharge Orders     None         Circe Chilton, MD 12/30/22 [phone]

## 2022-12-30 NOTE — Discharge Instructions (Signed)
No driving for 6 months or until cleared by neurology  

## 2023-01-04 ENCOUNTER — Encounter: Payer: Self-pay | Admitting: Neurology

## 2023-01-04 ENCOUNTER — Ambulatory Visit: Payer: Medicaid Other | Admitting: Neurology

## 2023-02-01 ENCOUNTER — Encounter: Payer: Self-pay | Admitting: Neurology

## 2023-02-01 ENCOUNTER — Ambulatory Visit: Payer: Medicaid Other | Admitting: Neurology

## 2023-02-01 VITALS — BP 135/81 | HR 85 | Ht 70.0 in | Wt 194.5 lb

## 2023-02-01 DIAGNOSIS — G40309 Generalized idiopathic epilepsy and epileptic syndromes, not intractable, without status epilepticus: Secondary | ICD-10-CM

## 2023-02-01 DIAGNOSIS — Z5181 Encounter for therapeutic drug level monitoring: Secondary | ICD-10-CM | POA: Diagnosis not present

## 2023-02-01 MED ORDER — LEVETIRACETAM 500 MG PO TABS: 500.0000 mg | ORAL_TABLET | Freq: Two times a day (BID) | ORAL | 11 refills | Status: DC

## 2023-02-01 MED ORDER — NAYZILAM 5 MG/0.1ML NA SOLN: 5.0000 mg | NASAL | 2 refills | Status: AC | PRN

## 2023-02-01 MED ORDER — COMPLETENATE 29-1 MG PO CHEW: 1.0000 | CHEWABLE_TABLET | Freq: Every day | ORAL | 11 refills | Status: DC

## 2023-02-01 NOTE — Progress Notes (Signed)
[facility]  PATIENT: Tracie Valenzuela DOB: Dec 19, 1985  REQUESTING CLINICIAN: No ref. provider found HISTORY FROM: Patient and boyfriend  REASON FOR VISIT: New onset epilepsy    HISTORICAL  CHIEF COMPLAINT:  Chief Complaint  Patient presents with   New Patient (Initial Visit)    Rm 13, boyfriend present Seizures: 3 days ago Frequency:once per month Just started keppra 3 days ago    HISTORY OF PRESENT ILLNESS:  This is a 37 year old woman past medical history of substance use including alcohol and marijuana who is presenting to establish care for seizure disorder.  Patient reports her first seizure happened in June of last year.  It was a nighttime seizure.  She woke up confused to paramedics on her.  At that time she did not go to the hospital per patient report but there is an ED note on 04/30/22.  From June until now in March she had a total of 4 seizures, and they are all nocturnal.  Her third seizure was in February, at that time she presented to the ED and was started on Keppra 500 mg twice daily.  She was not taking the Keppra until the last seizure 3 nights ago when she restarted the Keppra 500 mg twice daily.  While on the [place] she denies any side effect from the medication.  Of note patient also reported she has been drinking alcohol nightly. She reports since her last seizure 3 nights ago she discontinued alcohol and has been compliant with her Keppra.  Denies any withdrawal symptoms such as tremor, irritability or agitation.  She denies any family history of seizures, denies denies any seizure risk factors.   Handedness: Left handed   Onset: June of last year   Seizure Type: Nocturnal, generalized seizure   Current frequency: A total of 4, last one being 3 days ago   Any injuries from seizures: tongue bite, falling off bed    Seizure risk factors: None reported   Previous ASMs: None   Currenty ASMs: Levetiracetam 500 mg twice daily   ASMs  side effects: Denies   Brain Images: Normal head CT   Previous EEGs: Not done previously    OTHER MEDICAL CONDITIONS: Alcohol use disorder   REVIEW OF SYSTEMS: Full 14 system review of systems performed and negative with exception of: As noted in the HPI   ALLERGIES: No Known Allergies  HOME MEDICATIONS: Outpatient Medications Prior to Visit  Medication Sig Dispense Refill   levETIRAcetam (KEPPRA) 500 MG tablet Take 1 tablet (500 mg total) by mouth 2 (two) times daily. 60 tablet 0   norgestimate-ethinyl estradiol (ORTHO-CYCLEN,SPRINTEC,PREVIFEM) 0.25-35 MG-MCG tablet Take 1 tablet by mouth daily. (Patient not taking: Reported on 10/05/2018) 1 Package 11   No facility-administered medications prior to visit.    PAST MEDICAL HISTORY: Past Medical History:  Diagnosis Date   Medical history non-contributory    Seizures ([place])     PAST SURGICAL HISTORY: Past Surgical History:  Procedure Laterality Date   ACHILLES TENDON REPAIR     ACHILLES TENDON REPAIR Bilateral 2016   MENISCUS REPAIR     MENISCUS REPAIR  2006    FAMILY HISTORY: Family History  Problem Relation Age of Onset   Hypertension Father    Diabetes Father    Hypertension Maternal Aunt    Diabetes Maternal Aunt    Hypertension Maternal Uncle    Diabetes Maternal Uncle    Hypertension Paternal Aunt    Diabetes Paternal Aunt    Hypertension Paternal  Uncle    Diabetes Paternal Uncle    Hypertension Maternal Grandmother    Diabetes Maternal Grandmother    Hypertension Paternal Grandmother    Diabetes Paternal Grandmother     SOCIAL HISTORY: Social History   Socioeconomic History   Marital status: Single    Spouse name: Not on file   Number of children: Not on file   Years of education: Not on file   Highest education level: Not on file  Occupational History   Not on file  Tobacco Use   Smoking status: Never   Smokeless tobacco: Never  Vaping Use   Vaping Use: Never used  Substance and Sexual  Activity   Alcohol use: Yes    Alcohol/week: 14.0 standard drinks of alcohol    Types: 14 Cans of beer per week   Drug use: Yes    Comment: cannabis, cocaine hx   Sexual activity: Yes  Other Topics Concern   Not on file  Social History Narrative   Not on file   Social Determinants of Health   Financial Resource Strain: Not on file  Food Insecurity: Not on file  Transportation Needs: Not on file  Physical Activity: Not on file  Stress: Not on file  Social Connections: Not on file  Intimate Partner Violence: Not on file    PHYSICAL EXAM  GENERAL EXAM/CONSTITUTIONAL: Vitals:  Vitals:   02/01/23 1119  BP: 135/81  Pulse: 85  Weight: 194 lb 8 oz (88.2 kg)  Height: 5\' 10"  (1.778 m)   Body mass index is 27.91 kg/m. Wt Readings from Last 3 Encounters:  02/01/23 194 lb 8 oz (88.2 kg)  12/29/22 196 lb (88.9 kg)  07/23/22 220 lb 0.3 oz (99.8 kg)   Patient is in no distress; well developed, nourished and groomed; neck is supple, emotional and crying throughout visit.   EYES: Visual fields full to confrontation, Extraocular movements intacts,  No results found.  MUSCULOSKELETAL: Gait, strength, tone, movements noted in Neurologic exam below  NEUROLOGIC: MENTAL STATUS:      No data to display         awake, alert, oriented to person, place and time recent and remote memory intact normal attention and concentration language fluent, comprehension intact, naming intact fund of knowledge appropriate  CRANIAL NERVE:  2nd, 3rd, 4th, 6th - Visual fields full to confrontation, extraocular muscles intact, no nystagmus 5th - facial sensation symmetric 7th - facial strength symmetric 8th - hearing intact 9th - palate elevates symmetrically, uvula midline 11th - shoulder shrug symmetric 12th - tongue protrusion midline  MOTOR:  normal bulk and tone, full strength in the BUE, BLE  SENSORY:  normal and symmetric to light touch  COORDINATION:  finger-nose-finger,  fine finger movements normal  REFLEXES:  deep tendon reflexes present and symmetric  GAIT/STATION:  normal   DIAGNOSTIC DATA (LABS, IMAGING, TESTING) - I reviewed patient records, labs, notes, testing and imaging myself where available.  Lab Results  Component Value Date   WBC 12.8 (H) 12/29/2022   HGB 12.1 12/29/2022   HCT 37.3 12/29/2022   MCV 88.4 12/29/2022   PLT 281 12/29/2022      Component Value Date/Time   NA 135 12/29/2022 2200   K 4.2 12/29/2022 2200   CL 104 12/29/2022 2200   CO2 21 (L) 12/29/2022 2200   GLUCOSE 102 (H) 12/29/2022 2200   BUN 7 12/29/2022 2200   CREATININE 0.94 12/29/2022 2200   CALCIUM 8.6 (L) 12/29/2022 2200   PROT 6.0 (  L) 12/29/2022 2200   ALBUMIN 3.2 (L) 12/29/2022 2200   AST 18 12/29/2022 2200   ALT 11 12/29/2022 2200   ALKPHOS 47 12/29/2022 2200   BILITOT 0.3 12/29/2022 2200   GFRNONAA >60 12/29/2022 2200   GFRAA >60 10/05/2018 0759   No results found for: "CHOL", "HDL", "LDLCALC", "LDLDIRECT", "TRIG" Lab Results  Component Value Date   HGBA1C 5.4 11/23/2017   No results found for: "VITAMINB12" Lab Results  Component Value Date   TSH 1.436 07/23/2022    Head CT 04/30/22 No acute intracranial process identified    ASSESSMENT AND PLAN  37 y.o. year old female  with history of substance abuse including alcohol and marijuana was presenting to establish care for her seizure disorder.  Patient had a total of 4 seizures since June of last year, they are all nocturnal and described as generalized tonic-clonic seizures with foaming at the mouth, tongue biting and urinary incontinence.  She was started on Keppra but was noncompliant.  Discussed importance of medication compliance, she agreed to take her medication, Keppra 500 mg twice daily as directed.  I will check a Keppra level today.  I will also obtain a routine EEG.  I will start the patient on prenatal vitamin, she wanted to take the prenatal instead of folic acid. I will also give  her a rescue mediation, Nayzilam. Discussed driving restriction for the next 6 months. I will contact her to go over the result.  I will see her in 3 months for follow-up or sooner if worse     1. Generalized idiopathic epilepsy and epileptic syndromes, not intractable, without status epilepticus ([place])   2. Therapeutic drug monitoring     Patient Instructions  Continue with Keppra 500 mg twice daily Will check a Keppra level today Start with Prenatal vitamin daily Nayzilam as needed for rescue medication for seizures lasting more than 2 minutes Routine EEG Follow-up in 3 months or sooner if worse   Per [facility] statutes, patients with seizures are not allowed to drive until they have been seizure-free for six months.  Other recommendations include using caution when using heavy equipment or power tools. Avoid working on ladders or at heights. Take showers instead of baths.  Do not swim alone.  Ensure the water temperature is not too high on the home water heater. Do not go swimming alone. Do not lock yourself in a room alone (i.e. bathroom). When caring for infants or small children, sit down when holding, feeding, or changing them to minimize risk of injury to the child in the event you have a seizure. Maintain good sleep hygiene. Avoid alcohol.  Also recommend adequate sleep, hydration, good diet and minimize stress.   During the Seizure  - First, ensure adequate ventilation and place patients on the floor on their left side  Loosen clothing around the neck and ensure the airway is patent. If the patient is clenching the teeth, do not force the mouth open with any object as this can cause severe damage - Remove all items from the surrounding that can be hazardous. The patient may be oblivious to what's happening and may not even know what he or she is doing. If the patient is confused and wandering, either gently guide him/her away and block access to outside areas - Reassure  the individual and be comforting - Call 911. In most cases, the seizure ends before EMS arrives. However, there are cases when seizures may last over 3 to 5  minutes. Or the individual may have developed breathing difficulties or severe injuries. If a pregnant patient or a person with diabetes develops a seizure, it is prudent to call an ambulance. - Finally, if the patient does not regain full consciousness, then call EMS. Most patients will remain confused for about 45 to 90 minutes after a seizure, so you must use judgment in calling for help. - Avoid restraints but make sure the patient is in a bed with padded side rails - Place the individual in a lateral position with the neck slightly flexed; this will help the saliva drain from the mouth and prevent the tongue from falling backward - Remove all nearby furniture and other hazards from the area - Provide verbal assurance as the individual is regaining consciousness - Provide the patient with privacy if possible - Call for help and start treatment as ordered by the caregiver   After the Seizure (Postictal Stage)  After a seizure, most patients experience confusion, fatigue, muscle pain and/or a headache. Thus, one should permit the individual to sleep. For the next few days, reassurance is essential. Being calm and helping reorient the person is also of importance.  Most seizures are painless and end spontaneously. Seizures are not harmful to others but can lead to complications such as stress on the lungs, brain and the heart. Individuals with prior lung problems may develop labored breathing and respiratory distress.     Orders Placed This Encounter  Procedures   Levetiracetam level   EEG adult    Meds ordered this encounter  Medications   levETIRAcetam (KEPPRA) 500 MG tablet    Sig: Take 1 tablet (500 mg total) by mouth 2 (two) times daily.    Dispense:  60 tablet    Refill:  11   prenatal vitamin w/FE, FA (NATACHEW) 29-1 MG CHEW  chewable tablet    Sig: Chew 1 tablet by mouth daily at 12 noon.    Dispense:  30 tablet    Refill:  11   Midazolam (NAYZILAM) 5 MG/0.1ML SOLN    Sig: Place 5 mg into the nose as needed (For seizure lasting more than 2 minutes).    Dispense:  2 each    Refill:  2    Return in about 3 months (around 05/04/2023).    Alric Ran, MD 02/01/2023, 12:52 PM  [facility] [address] [place], [place] [address] [phone]

## 2023-02-01 NOTE — Patient Instructions (Addendum)
Continue with Keppra 500 mg twice daily Will check a Keppra level today Start with Prenatal vitamin daily Nayzilam as needed for rescue medication for seizures lasting more than 2 minutes Routine EEG Follow-up in 3 months or sooner if worse

## 2023-02-03 ENCOUNTER — Telehealth: Payer: Self-pay

## 2023-02-03 LAB — LEVETIRACETAM LEVEL: Levetiracetam Lvl: 16.5 ug/mL (ref 10.0–40.0)

## 2023-02-03 NOTE — Progress Notes (Signed)
Please call and advise the patient that the recent Keppra level was normal. No further action is required on these tests at this time. Please remind patient to keep any upcoming appointments or tests and to call us with any interim questions, concerns, problems or updates. Thanks,  ° °Ivett Luebbe, MD ° °

## 2023-02-03 NOTE — Telephone Encounter (Signed)
-----   Message from Alric Ran, MD sent at 02/03/2023  9:53 AM EDT ----- Please call and advise the patient that the recent Keppra level was normal. No further action is required on these tests at this time. Please remind patient to keep any upcoming appointments or tests and to call us with any interim questions, concerns, problems or updates. Thanks,   Alric Ran, MD

## 2023-02-03 NOTE — Telephone Encounter (Signed)
Called and LVM per DPR 

## 2023-02-16 ENCOUNTER — Other Ambulatory Visit: Payer: Medicaid Other | Admitting: *Deleted

## 2023-02-16 ENCOUNTER — Encounter: Payer: Self-pay | Admitting: *Deleted

## 2023-03-08 ENCOUNTER — Other Ambulatory Visit: Payer: Medicaid Other | Admitting: *Deleted

## 2023-03-08 ENCOUNTER — Encounter: Payer: Self-pay | Admitting: *Deleted

## 2023-03-15 ENCOUNTER — Encounter: Payer: Self-pay | Admitting: Neurology

## 2023-03-15 ENCOUNTER — Telehealth: Payer: Self-pay | Admitting: Neurology

## 2023-03-15 NOTE — Telephone Encounter (Signed)
Sent letter in mail informing pt of appt change from 07/05/23 to 07/12/23 due to provider schedule change

## 2023-03-17 ENCOUNTER — Ambulatory Visit: Payer: Medicaid Other | Admitting: Neurology

## 2023-06-23 ENCOUNTER — Other Ambulatory Visit: Payer: Self-pay

## 2023-06-23 ENCOUNTER — Emergency Department (HOSPITAL_COMMUNITY)
Admission: EM | Admit: 2023-06-23 | Discharge: 2023-06-23 | Disposition: A | Discharge status: Home / Self Care | Payer: Medicaid Other | Attending: Emergency Medicine | Admitting: Emergency Medicine

## 2023-06-23 DIAGNOSIS — R569 Unspecified convulsions: Secondary | ICD-10-CM | POA: Insufficient documentation

## 2023-06-23 LAB — CBC WITH DIFFERENTIAL/PLATELET
Abs Immature Granulocytes: 0.07 10*3/uL (ref 0.00–0.07)
Basophils Absolute: 0.1 10*3/uL (ref 0.0–0.1)
Basophils Relative: 0 %
Eosinophils Absolute: 0.2 10*3/uL (ref 0.0–0.5)
Eosinophils Relative: 1 %
HCT: 37.4 % (ref 36.0–46.0)
Hemoglobin: 12.5 g/dL (ref 12.0–15.0)
Immature Granulocytes: 1 %
Lymphocytes Relative: 29 %
Lymphs Abs: 3.8 10*3/uL (ref 0.7–4.0)
MCH: 29.1 pg (ref 26.0–34.0)
MCHC: 33.4 g/dL (ref 30.0–36.0)
MCV: 87.2 fL (ref 80.0–100.0)
Monocytes Absolute: 1.3 10*3/uL — ABNORMAL HIGH (ref 0.1–1.0)
Monocytes Relative: 9 %
Neutro Abs: 8 10*3/uL — ABNORMAL HIGH (ref 1.7–7.7)
Neutrophils Relative %: 60 %
Platelets: 315 10*3/uL (ref 150–400)
RBC: 4.29 MIL/uL (ref 3.87–5.11)
RDW: 12.8 % (ref 11.5–15.5)
WBC: 13.4 10*3/uL — ABNORMAL HIGH (ref 4.0–10.5)
nRBC: 0 % (ref 0.0–0.2)

## 2023-06-23 LAB — BASIC METABOLIC PANEL
Anion gap: 7 (ref 5–15)
BUN: 12 mg/dL (ref 6–20)
CO2: 23 mmol/L (ref 22–32)
Calcium: 8.4 mg/dL — ABNORMAL LOW (ref 8.9–10.3)
Chloride: 104 mmol/L (ref 98–111)
Creatinine, Ser: 0.93 mg/dL (ref 0.44–1.00)
GFR, Estimated: >60 mL/min (ref >60)
Glucose, Bld: 106 mg/dL — ABNORMAL HIGH (ref 70–99)
Potassium: 3.7 mmol/L (ref 3.5–5.1)
Sodium: 134 mmol/L — ABNORMAL LOW (ref 135–145)

## 2023-06-23 LAB — HCG, QUANTITATIVE, PREGNANCY: hCG, Beta Chain, Quant, S: <1 m[IU]/mL (ref <5)

## 2023-06-23 MED ORDER — LEVETIRACETAM IN NACL 1000 MG/100ML IV SOLN
1000.0000 mg | Freq: Once | INTRAVENOUS | Status: AC
  Administered 2023-06-23: 1000 mg via INTRAVENOUS
  Filled 2023-06-23: qty 100

## 2023-06-23 MED ORDER — LEVETIRACETAM 500 MG PO TABS: 500.0000 mg | ORAL_TABLET | Freq: Two times a day (BID) | ORAL | 0 refills | Status: DC

## 2023-06-23 NOTE — Discharge Instructions (Signed)
You were seen today with concern for seizure.  It is very important that you restart your Keppra.  Follow-up with your neurologist if you are not tolerating Keppra for alternative medications.

## 2023-06-23 NOTE — ED Provider Notes (Signed)
EMERGENCY DEPARTMENT AT [facility] Provider Note   CSN: [id number] Arrival date & time: 06/23/23  0057     History  Chief Complaint  Patient presents with   Seizures    Hx of the same , not currently taking meds, witness by daughter, appx 2 mins, 12 unremarkable , 18G left forearm     Sherrilyn Tiburcio is a 37 y.o. female.  HPI     This is a 37 year old female brought in by EMS with concern for seizure.  Patient reports that she was diagnosed with epilepsy within the last 1 to 2 years.  She is supposed to take Keppra.  However, she states that it began to make her feel bad and she did not like the way it made her feel so she has not been taking it for the last month.  Per EMS she had a witnessed seizure by her daughter.  Patient does not remember the event.  She does report increased stress and poor sleep hygiene at home.  Denies any recent illnesses.  No available collateral information.  Home Medications Prior to Admission medications   Medication Sig Start Date End Date Taking? Authorizing Provider  levETIRAcetam (KEPPRA) 500 MG tablet Take 1 tablet (500 mg total) by mouth 2 (two) times daily. 06/23/23   Hannahgrace Lalli, Mayer Masker, MD  Midazolam (NAYZILAM) 5 MG/0.1ML SOLN Place 5 mg into the nose as needed (For seizure lasting more than 2 minutes). 02/01/23   Windell Norfolk, MD  prenatal vitamin w/FE, FA (NATACHEW) 29-1 MG CHEW chewable tablet Chew 1 tablet by mouth daily at 12 noon. 02/01/23 01/27/24  Windell Norfolk, MD      Allergies    Patient has no known allergies.    Review of Systems   Review of Systems  Neurological:  Positive for seizures.  All other systems reviewed and are negative.   Physical Exam Updated Vital Signs BP 114/79 (BP Location: Right Arm)   Pulse 81   Temp 98.6 F (37 C) (Oral)   Resp 13   SpO2 100%  Physical Exam Vitals and nursing note reviewed.  Constitutional:      Appearance: She is well-developed. She is not  ill-appearing.  HENT:     Head: Normocephalic and atraumatic.  Eyes:     Extraocular Movements: Extraocular movements intact.     Pupils: Pupils are equal, round, and reactive to light.  Cardiovascular:     Rate and Rhythm: Normal rate and regular rhythm.     Heart sounds: Normal heart sounds.  Pulmonary:     Effort: Pulmonary effort is normal. No respiratory distress.     Breath sounds: No wheezing.  Abdominal:     Palpations: Abdomen is soft.  Musculoskeletal:     Cervical back: Neck supple.  Skin:    General: Skin is warm and dry.  Neurological:     Mental Status: She is alert and oriented to person, place, and time.     Comments: Fluent speech, cranial nerves II through XII intact, 5 out of 5 strength in all 4 extremities, no dysmetria to finger-nose-finger  Psychiatric:        Mood and Affect: Mood normal.     ED Results / Procedures / Treatments   Labs (all labs ordered are listed, but only abnormal results are displayed) Labs Reviewed  CBC WITH DIFFERENTIAL/PLATELET - Abnormal; Notable for the following components:      Result Value   WBC 13.4 (*)    Neutro  Abs 8.0 (*)    Monocytes Absolute 1.3 (*)    All other components within normal limits  BASIC METABOLIC PANEL - Abnormal; Notable for the following components:   Sodium 134 (*)    Glucose, Bld 106 (*)    Calcium 8.4 (*)    All other components within normal limits  HCG, QUANTITATIVE, PREGNANCY    EKG None  Radiology No results found.  Procedures Procedures    Medications Ordered in ED Medications  levETIRAcetam (KEPPRA) IVPB 1000 mg/100 mL premix (0 mg Intravenous Stopped 06/23/23 0226)    ED Course/ Medical Decision Making/ A&P                                 Medical Decision Making Amount and/or Complexity of Data Reviewed Labs: ordered.  Risk Prescription drug management.   This patient presents to the ED for concern of seizure, this involves an extensive number of treatment options,  and is a complaint that carries with it a high risk of complications and morbidity.  I considered the following differential and admission for this acute, potentially life threatening condition.  The differential diagnosis includes seizure, syncope, metabolic derangement  MDM:    This is a 37 year old female who presents following reported seizure.  She is overall nontoxic and vital signs are reassuring.  She does not have any recollection of the event.  History mostly from EMS.  She does have a history of seizures but is not currently taking her Keppra.  Labs obtained and reviewed.  Not pregnant.  No significant metabolic derangement.  Patient was observed in state at her baseline.  She was loaded with 1 g of IV Keppra.  I have encouraged her to restart her Keppra 500 mg twice daily and follow-up with her neurologist regarding alternative medications if she does not tolerate.  (Labs, imaging, consults)  Labs: I Ordered, and personally interpreted labs.  The pertinent results include: CBC, BMP, beta-hCG  Imaging Studies ordered: I ordered imaging studies including none I independently visualized and interpreted imaging. I agree with the radiologist interpretation  Additional history obtained from chart review.  External records from outside source obtained and reviewed including neurology notes  Cardiac Monitoring: The patient was maintained on a cardiac monitor.  If on the cardiac monitor, I personally viewed and interpreted the cardiac monitored which showed an underlying rhythm of: Sinus rhythm  Reevaluation: After the interventions noted above, I reevaluated the patient and found that they have :stayed the same  Social Determinants of Health:  lives independently  Disposition: Discharge  Co morbidities that complicate the patient evaluation  Past Medical History:  Diagnosis Date   Medical history non-contributory    Seizures (HCC)      Medicines Meds ordered this encounter   Medications   levETIRAcetam (KEPPRA) IVPB 1000 mg/100 mL premix   levETIRAcetam (KEPPRA) 500 MG tablet    Sig: Take 1 tablet (500 mg total) by mouth 2 (two) times daily.    Dispense:  60 tablet    Refill:  0    I have reviewed the patients home medicines and have made adjustments as needed  Problem List / ED Course: Problem List Items Addressed This Visit   None Visit Diagnoses     Seizure (HCC)    -  Primary   Relevant Medications   levETIRAcetam (KEPPRA) IVPB 1000 mg/100 mL premix (Completed)   levETIRAcetam (KEPPRA) 500 MG tablet  Final Clinical Impression(s) / ED Diagnoses Final diagnoses:  Seizure [facility])    Rx / DC Orders ED Discharge Orders          Ordered    levETIRAcetam (KEPPRA) 500 MG tablet  2 times daily        06/23/23 0346              Jahmiya Guidotti, Mayer Masker, MD 06/23/23 [phone]

## 2023-07-05 ENCOUNTER — Ambulatory Visit: Payer: Medicaid Other | Admitting: Neurology

## 2023-07-07 ENCOUNTER — Emergency Department (HOSPITAL_COMMUNITY)
Admission: EM | Admit: 2023-07-07 | Discharge: 2023-07-07 | Disposition: A | Discharge status: Home / Self Care | Payer: Medicaid Other | Attending: Emergency Medicine | Admitting: Emergency Medicine

## 2023-07-07 ENCOUNTER — Encounter (HOSPITAL_COMMUNITY): Payer: Self-pay

## 2023-07-07 DIAGNOSIS — R590 Localized enlarged lymph nodes: Secondary | ICD-10-CM | POA: Diagnosis not present

## 2023-07-07 DIAGNOSIS — Z1152 Encounter for screening for COVID-19: Secondary | ICD-10-CM | POA: Diagnosis not present

## 2023-07-07 DIAGNOSIS — J029 Acute pharyngitis, unspecified: Secondary | ICD-10-CM | POA: Insufficient documentation

## 2023-07-07 LAB — RESP PANEL BY RT-PCR (RSV, FLU A&B, COVID)  RVPGX2
Influenza A by PCR: NEGATIVE
Influenza B by PCR: NEGATIVE
Resp Syncytial Virus by PCR: NEGATIVE
SARS Coronavirus 2 by RT PCR: NEGATIVE

## 2023-07-07 LAB — GROUP A STREP BY PCR: Group A Strep by PCR: NOT DETECTED

## 2023-07-07 MED ORDER — IBUPROFEN 200 MG PO TABS
600.0000 mg | ORAL_TABLET | Freq: Once | ORAL | Status: AC
  Administered 2023-07-07: 600 mg via ORAL
  Filled 2023-07-07: qty 3

## 2023-07-07 MED ORDER — LIDOCAINE VISCOUS HCL 2 % MT SOLN
15.0000 mL | Freq: Once | OROMUCOSAL | Status: AC
  Administered 2023-07-07: 15 mL via OROMUCOSAL
  Filled 2023-07-07: qty 15

## 2023-07-07 MED ORDER — DEXAMETHASONE 4 MG PO TABS
10.0000 mg | ORAL_TABLET | Freq: Once | ORAL | Status: AC
  Administered 2023-07-07: 10 mg via ORAL
  Filled 2023-07-07: qty 1

## 2023-07-07 NOTE — Discharge Instructions (Signed)
Continue to use treatment such as ibuprofen and Tylenol to help with pain and fever.  Drink plenty of fluids.  You have been given some steroids which will hopefully help decrease the size of your tonsils and help with discomfort.  Otherwise, if you develop high fever that does not go away, inability to swallow, trouble breathing, or any other new/concerning symptoms then return to the ER or call 911.

## 2023-07-07 NOTE — ED Provider Notes (Signed)
[place] EMERGENCY DEPARTMENT AT [facility] Provider Note   CSN: [id number] Arrival date & time: 07/07/23  4098     History  Chief Complaint  Patient presents with   Sore Throat    Tracie Valenzuela is a 37 y.o. female.  HPI 36 year old female presents with a sore throat that started yesterday.  Got a lot worse overnight and now it is difficult to swallow due to the severity of pain and she keeps spitting up phlegm.  She is able to swallow but it is painful.  She had a low-grade fever on arrival at 100.  She has a headache and feels diffusely awful.  No shortness of breath.  Home Medications Prior to Admission medications   Medication Sig Start Date End Date Taking? Authorizing Provider  levETIRAcetam (KEPPRA) 500 MG tablet Take 1 tablet (500 mg total) by mouth 2 (two) times daily. 06/23/23   Horton, Mayer Masker, MD  Midazolam (NAYZILAM) 5 MG/0.1ML SOLN Place 5 mg into the nose as needed (For seizure lasting more than 2 minutes). 02/01/23   Windell Norfolk, MD  prenatal vitamin w/FE, FA (NATACHEW) 29-1 MG CHEW chewable tablet Chew 1 tablet by mouth daily at 12 noon. 02/01/23 01/27/24  Windell Norfolk, MD      Allergies    Patient has no known allergies.    Review of Systems   Review of Systems  Constitutional:  Positive for fever.  HENT:  Positive for congestion and sore throat.   Respiratory:  Negative for shortness of breath.   Gastrointestinal:  Negative for vomiting.    Physical Exam Updated Vital Signs BP (!) 138/90 (BP Location: Left Arm)   Pulse 96   Temp 100 F (37.8 C) (Oral)   Resp 18   LMP 06/25/2023   SpO2 100%  Physical Exam Vitals and nursing note reviewed.  Constitutional:      General: She is not in acute distress.    Appearance: She is well-developed. She is not ill-appearing or diaphoretic.  HENT:     Head: Normocephalic and atraumatic.     Mouth/Throat:     Pharynx: No uvula swelling.     Tonsils: Tonsillar exudate present. No  tonsillar abscesses. 2+ on the right. 2+ on the left.     Comments: Exudate on right tonsil greater than left. No PTA. Cardiovascular:     Rate and Rhythm: Normal rate and regular rhythm.     Heart sounds: Normal heart sounds.  Pulmonary:     Effort: Pulmonary effort is normal.     Breath sounds: Normal breath sounds. No wheezing.  Lymphadenopathy:     Cervical: Cervical adenopathy present.  Skin:    General: Skin is warm and dry.  Neurological:     Mental Status: She is alert.    ED Results / Procedures / Treatments   Labs (all labs ordered are listed, but only abnormal results are displayed) Labs Reviewed  RESP PANEL BY RT-PCR (RSV, FLU A&B, COVID)  RVPGX2  GROUP A STREP BY PCR    EKG None  Radiology No results found.  Procedures Procedures    Medications Ordered in ED Medications  dexamethasone (DECADRON) tablet 10 mg (has no administration in time range)  ibuprofen (ADVIL) tablet 600 mg (600 mg Oral Given 07/07/23 0833)  lidocaine (XYLOCAINE) 2 % viscous mouth solution 15 mL (15 mLs Mouth/Throat Given 07/07/23 0909)    ED Course/ Medical Decision Making/ A&P  Medical Decision Making Risk OTC drugs. Prescription drug management.   Patient is overall well-appearing.  She is able to swallow meds and fluids.  Vital signs are unremarkable at this time.  I think over-the-counter supportive care is warranted as this appears to be a viral illness with a negative strep test as well as negative COVID/flu.  Highly doubt peritonsillar abscess or other deep space infection.  I do not think antibiotics are warranted at this time.  Will give return precautions as well as a dose of Decadron given some moderate tonsillar swelling and have her follow-up with PCP and return if symptoms worsen or do not improve.        Final Clinical Impression(s) / ED Diagnoses Final diagnoses:  Viral pharyngitis    Rx / DC Orders ED Discharge Orders      None         Pricilla Loveless, MD 07/07/23 [phone]

## 2023-07-07 NOTE — ED Triage Notes (Signed)
Pt presents with c/o sore throat. Pt reports that her throat began to hurt yesterday and now she is having pain with swallowing.

## 2023-07-10 ENCOUNTER — Emergency Department (HOSPITAL_COMMUNITY): Payer: Medicaid Other

## 2023-07-10 ENCOUNTER — Other Ambulatory Visit: Payer: Self-pay

## 2023-07-10 ENCOUNTER — Emergency Department (HOSPITAL_COMMUNITY)
Admission: EM | Admit: 2023-07-10 | Discharge: 2023-07-10 | Disposition: A | Discharge status: Home / Self Care | Payer: Medicaid Other | Attending: Emergency Medicine | Admitting: Emergency Medicine

## 2023-07-10 ENCOUNTER — Telehealth (HOSPITAL_COMMUNITY): Payer: Self-pay | Admitting: Emergency Medicine

## 2023-07-10 ENCOUNTER — Encounter (HOSPITAL_COMMUNITY): Payer: Self-pay

## 2023-07-10 DIAGNOSIS — Z20822 Contact with and (suspected) exposure to covid-19: Secondary | ICD-10-CM | POA: Insufficient documentation

## 2023-07-10 DIAGNOSIS — J039 Acute tonsillitis, unspecified: Secondary | ICD-10-CM | POA: Diagnosis not present

## 2023-07-10 DIAGNOSIS — J029 Acute pharyngitis, unspecified: Secondary | ICD-10-CM | POA: Diagnosis present

## 2023-07-10 LAB — CBC
HCT: 38.2 % (ref 36.0–46.0)
Hemoglobin: 12.4 g/dL (ref 12.0–15.0)
MCH: 28.4 pg (ref 26.0–34.0)
MCHC: 32.5 g/dL (ref 30.0–36.0)
MCV: 87.4 fL (ref 80.0–100.0)
Platelets: 287 10*3/uL (ref 150–400)
RBC: 4.37 MIL/uL (ref 3.87–5.11)
RDW: 12.9 % (ref 11.5–15.5)
WBC: 25.9 10*3/uL — ABNORMAL HIGH (ref 4.0–10.5)
nRBC: 0 % (ref 0.0–0.2)

## 2023-07-10 LAB — BASIC METABOLIC PANEL
Anion gap: 11 (ref 5–15)
BUN: 8 mg/dL (ref 6–20)
CO2: 24 mmol/L (ref 22–32)
Calcium: 8.4 mg/dL — ABNORMAL LOW (ref 8.9–10.3)
Chloride: 99 mmol/L (ref 98–111)
Creatinine, Ser: 0.86 mg/dL (ref 0.44–1.00)
GFR, Estimated: >60 mL/min (ref >60)
Glucose, Bld: 156 mg/dL — ABNORMAL HIGH (ref 70–99)
Potassium: 3 mmol/L — ABNORMAL LOW (ref 3.5–5.1)
Sodium: 134 mmol/L — ABNORMAL LOW (ref 135–145)

## 2023-07-10 LAB — RESP PANEL BY RT-PCR (RSV, FLU A&B, COVID)  RVPGX2
Influenza A by PCR: NEGATIVE
Influenza B by PCR: NEGATIVE
Resp Syncytial Virus by PCR: NEGATIVE
SARS Coronavirus 2 by RT PCR: NEGATIVE

## 2023-07-10 LAB — GROUP A STREP BY PCR: Group A Strep by PCR: NOT DETECTED

## 2023-07-10 LAB — I-STAT CG4 LACTIC ACID, ED: Lactic Acid, Venous: 1.1 mmol/L (ref 0.5–1.9)

## 2023-07-10 LAB — MONONUCLEOSIS SCREEN: Mono Screen: NEGATIVE

## 2023-07-10 LAB — HCG, QUANTITATIVE, PREGNANCY: hCG, Beta Chain, Quant, S: <1 m[IU]/mL (ref <5)

## 2023-07-10 MED ORDER — PENICILLIN V POTASSIUM 500 MG PO TABS: 500.0000 mg | ORAL_TABLET | Freq: Two times a day (BID) | ORAL | 0 refills | Status: DC

## 2023-07-10 MED ORDER — LACTATED RINGERS IV BOLUS
1000.0000 mL | Freq: Once | INTRAVENOUS | Status: AC
  Administered 2023-07-10: 1000 mL via INTRAVENOUS

## 2023-07-10 MED ORDER — PREDNISONE 10 MG (21) PO TBPK: ORAL_TABLET | Freq: Every day | ORAL | 0 refills | Status: DC

## 2023-07-10 MED ORDER — ACETAMINOPHEN 500 MG PO TABS
1000.0000 mg | ORAL_TABLET | Freq: Once | ORAL | Status: AC
  Administered 2023-07-10: 1000 mg via ORAL
  Filled 2023-07-10: qty 2

## 2023-07-10 MED ORDER — IOHEXOL 300 MG/ML  SOLN
100.0000 mL | Freq: Once | INTRAMUSCULAR | Status: AC | PRN
  Administered 2023-07-10: 75 mL via INTRAVENOUS

## 2023-07-10 MED ORDER — ONDANSETRON HCL 4 MG/2ML IJ SOLN
4.0000 mg | Freq: Once | INTRAMUSCULAR | Status: AC
  Administered 2023-07-10: 4 mg via INTRAVENOUS
  Filled 2023-07-10: qty 2

## 2023-07-10 MED ORDER — PENICILLIN V POTASSIUM 500 MG PO TABS
500.0000 mg | ORAL_TABLET | Freq: Once | ORAL | Status: AC
  Administered 2023-07-10: 500 mg via ORAL
  Filled 2023-07-10: qty 1

## 2023-07-10 MED ORDER — FENTANYL CITRATE PF 50 MCG/ML IJ SOSY
50.0000 ug | PREFILLED_SYRINGE | Freq: Once | INTRAMUSCULAR | Status: AC
  Administered 2023-07-10: 50 ug via INTRAVENOUS
  Filled 2023-07-10: qty 1

## 2023-07-10 MED ORDER — DEXAMETHASONE SODIUM PHOSPHATE 10 MG/ML IJ SOLN
10.0000 mg | Freq: Once | INTRAMUSCULAR | Status: AC
  Administered 2023-07-10: 10 mg via INTRAVENOUS
  Filled 2023-07-10: qty 1

## 2023-07-10 MED ORDER — POTASSIUM CHLORIDE CRYS ER 20 MEQ PO TBCR
40.0000 meq | EXTENDED_RELEASE_TABLET | Freq: Once | ORAL | Status: AC
  Administered 2023-07-10: 40 meq via ORAL
  Filled 2023-07-10: qty 2

## 2023-07-10 NOTE — ED Triage Notes (Signed)
Pt arrived via POV. C/o N/V, fever, and weakness for 3x days.   Temp of 101.1 HR 138 in triage

## 2023-07-10 NOTE — Discharge Instructions (Addendum)
It was a pleasure taking part in your care today.  As we discussed, you have tonsillitis.  Please begin taking penicillin 500 mg twice daily for the next 10 days.  Please also begin taking steroid Dosepak that I prescribed your pharmacy.  Please follow-up with ENT which I referred you to.  Please return to the ED with any new or worsening signs or symptoms such as trouble swallowing, trouble breathing.  Please take Tylenol for fevers at home.  Please remain hydrated.

## 2023-07-10 NOTE — Telephone Encounter (Cosign Needed)
Patient called stating that her medications needed to be sent to [facility] as opposed to [facility].  She states her insurance will not cover [facility] refills until 9/9.

## 2023-07-10 NOTE — ED Provider Notes (Signed)
[place] EMERGENCY DEPARTMENT AT [facility] Provider Note   CSN: [id number] Arrival date & time: 07/10/23  1128     History  Chief Complaint  Patient presents with   Fever   Emesis   Nausea    Tracie Valenzuela is a 37 y.o. female with medical history of seizures.  Patient presents to ED for evaluation of 4 days of sore throat, fever, body aches and chills, nausea and vomiting.  She reports that she was seen 2 days ago and diagnosed with viral pharyngitis.  She states that she has been taking ibuprofen, Tylenol at home but her symptoms are persisting.  She reports that she is not having any trouble swallowing but states that she does have a change to her voice.  She denies chest pain, shortness of breath.  She is endorsing nausea and vomiting.  Patient is very tachycardic on the monitor, has very obvious left-sided tonsillar swelling with exudate.  Denies drooling, change phonation, trouble swallowing, shortness of breath.  Fever Associated symptoms: nausea, sore throat and vomiting   Associated symptoms: no chest pain, no cough and no diarrhea   Emesis Associated symptoms: fever and sore throat   Associated symptoms: no abdominal pain, no cough and no diarrhea        Home Medications Prior to Admission medications   Medication Sig Start Date End Date Taking? Authorizing Provider  penicillin v potassium (VEETID) 500 MG tablet Take 1 tablet (500 mg total) by mouth in the morning and at bedtime for 10 days. 07/10/23 07/20/23 Yes Al Decant, PA-C  predniSONE (STERAPRED UNI-PAK 21 TAB) 10 MG (21) TBPK tablet Take by mouth daily. Take 6 tabs by mouth daily  for 2 days, then 5 tabs for 2 days, then 4 tabs for 2 days, then 3 tabs for 2 days, 2 tabs for 2 days, then 1 tab by mouth daily for 2 days 07/10/23  Yes Al Decant, PA-C  levETIRAcetam (KEPPRA) 500 MG tablet Take 1 tablet (500 mg total) by mouth 2 (two) times daily. 06/23/23   Horton, Mayer Masker, MD   Midazolam (NAYZILAM) 5 MG/0.1ML SOLN Place 5 mg into the nose as needed (For seizure lasting more than 2 minutes). 02/01/23   Windell Norfolk, MD  prenatal vitamin w/FE, FA (NATACHEW) 29-1 MG CHEW chewable tablet Chew 1 tablet by mouth daily at 12 noon. 02/01/23 01/27/24  Windell Norfolk, MD      Allergies    Patient has no known allergies.    Review of Systems   Review of Systems  Constitutional:  Positive for fever.  HENT:  Positive for sore throat. Negative for drooling, trouble swallowing and voice change.   Respiratory:  Negative for cough and shortness of breath.   Cardiovascular:  Negative for chest pain.  Gastrointestinal:  Positive for nausea and vomiting. Negative for abdominal pain and diarrhea.    Physical Exam Updated Vital Signs BP 119/87   Pulse 81   Temp 99.3 F (37.4 C) (Oral)   Resp 13   Ht 5\' 10"  (1.778 m)   Wt 90.7 kg   LMP 06/25/2023   SpO2 100%   BMI 28.70 kg/m  Physical Exam Vitals and nursing note reviewed.  Constitutional:      General: She is not in acute distress.    Appearance: Normal appearance. She is not ill-appearing, toxic-appearing or diaphoretic.  HENT:     Head: Normocephalic and atraumatic.     Nose: Nose normal.  Mouth/Throat:     Mouth: Mucous membranes are moist.     Pharynx: Oropharyngeal exudate and posterior oropharyngeal erythema present.     Tonsils: Tonsillar exudate present. No tonsillar abscesses. 2+ on the right. 3+ on the left.     Comments: Left tonsil 3+, right tonsil 2+.  Exudate and erythema throughout.  Uvula midline, handling secretions appropriately, no change phonation, no drooling. Eyes:     Extraocular Movements: Extraocular movements intact.     Conjunctiva/sclera: Conjunctivae normal.     Pupils: Pupils are equal, round, and reactive to light.  Cardiovascular:     Rate and Rhythm: Regular rhythm. Tachycardia present.  Abdominal:     General: Abdomen is flat. Bowel sounds are normal.     Palpations: Abdomen  is soft.     Tenderness: There is no abdominal tenderness.  Musculoskeletal:     Cervical back: Normal range of motion and neck supple. No tenderness.  Skin:    General: Skin is warm and dry.     Capillary Refill: Capillary refill takes less than 2 seconds.  Neurological:     Mental Status: She is alert and oriented to person, place, and time.     ED Results / Procedures / Treatments   Labs (all labs ordered are listed, but only abnormal results are displayed) Labs Reviewed  CBC - Abnormal; Notable for the following components:      Result Value   WBC 25.9 (*)    All other components within normal limits  BASIC METABOLIC PANEL - Abnormal; Notable for the following components:   Sodium 134 (*)    Potassium 3.0 (*)    Glucose, Bld 156 (*)    Calcium 8.4 (*)    All other components within normal limits  RESP PANEL BY RT-PCR (RSV, FLU A&B, COVID)  RVPGX2  GROUP A STREP BY PCR  CULTURE, BLOOD (ROUTINE X 2)  CULTURE, BLOOD (ROUTINE X 2)  HCG, QUANTITATIVE, PREGNANCY  MONONUCLEOSIS SCREEN  I-STAT CG4 LACTIC ACID, ED  I-STAT CG4 LACTIC ACID, ED    EKG None  Radiology CT Soft Tissue Neck W Contrast  Result Date: 07/10/2023 CLINICAL DATA:  Epiglottitis or tonsillitis suspected Soft tissue infection suspected, neck, xray done EXAM: CT NECK WITH CONTRAST TECHNIQUE: Multidetector CT imaging of the neck was performed using the standard protocol following the bolus administration of intravenous contrast. RADIATION DOSE REDUCTION: This exam was performed according to the departmental dose-optimization program which includes automated exposure control, adjustment of the mA and/or kV according to patient size and/or use of iterative reconstruction technique. CONTRAST:  75mL OMNIPAQUE IOHEXOL 300 MG/ML  SOLN COMPARISON:  None Available. FINDINGS: Pharynx and larynx: Edematous and enlarged tonsils with mildly striated enhancement, compatible with tonsillitis. Mild surrounding edema without  discrete, drainable fluid collection. Unremarkable epiglottis. Normal larynx. Salivary glands: No inflammation, mass, or stone. Thyroid: Normal. Lymph nodes: Prominent upper cervical chain lymph nodes bilaterally. Vascular: Limited evaluation due to non arterial timing. Major arteries are patent in the neck. Limited intracranial: Negative. Visualized orbits: Negative. Mastoids and visualized paranasal sinuses: Clear. Skeleton: No acute or aggressive process. Upper chest: Approximately 7 mm nodular opacity in left upper lobe may be infectious/inflammatory. Additional 3 mm left upper lobe nodular opacity. IMPRESSION: 1. Findings compatible with tonsillitis. Mild surrounding edema without discrete, drainable fluid collection. 2. Prominent upper cervical chain lymph nodes bilaterally, nonspecific but potentially reactive given the above findings. 3. Multiple nodular opacities in the left lung apex, measuring up to 7 mm. Non-contrast  chest CT at 3-6 months is recommended to ensure resolution. If the nodules are stable at time of repeat CT, then future CT at 18-24 months (from today's scan) is considered optional for low-risk patients, but is recommended for high-risk patients. This recommendation follows the consensus statement: Guidelines for Management of Incidental Pulmonary Nodules Detected on CT Images: From the Fleischner Society 2017; Radiology 2017; 284:228-243. Electronically Signed   By: Feliberto Harts M.D.   On: 07/10/2023 15:18    Procedures Procedures   Medications Ordered in ED Medications  potassium chloride SA (KLOR-CON M) CR tablet 40 mEq (has no administration in time range)  acetaminophen (TYLENOL) tablet 1,000 mg (1,000 mg Oral Given 07/10/23 1151)  lactated ringers bolus 1,000 mL (0 mLs Intravenous Stopped 07/10/23 1524)  ondansetron (ZOFRAN) injection 4 mg (4 mg Intravenous Given 07/10/23 1352)  dexamethasone (DECADRON) injection 10 mg (10 mg Intravenous Given 07/10/23 1352)  fentaNYL  (SUBLIMAZE) injection 50 mcg (50 mcg Intravenous Given 07/10/23 1352)  acetaminophen (TYLENOL) tablet 1,000 mg (1,000 mg Oral Given 07/10/23 1433)  iohexol (OMNIPAQUE) 300 MG/ML solution 100 mL (75 mLs Intravenous Contrast Given 07/10/23 1421)  penicillin v potassium (VEETID) tablet 500 mg (500 mg Oral Given 07/10/23 1654)    ED Course/ Medical Decision Making/ A&P  Medical Decision Making Amount and/or Complexity of Data Reviewed Labs: ordered. Radiology: ordered.  Risk OTC drugs. Prescription drug management.   37 year old female presents to ED for evaluation.  Please see HPI for further details.  On examination the patient is febrile and tachycardic.  Her lung sounds are clear bilaterally, she is not hypoxic.  Her abdomen is soft and compressible throughout.  Posterior oropharynx has 3+ tonsillar swelling on the left, 2+ tonsillar swelling on the right.  There is exudate and erythema throughout.  Her uvula is midline, she is handling secretions appropriately, she is not drooling, there is no change in phonation.  She is toxic in appearance, she is febrile and tachycardic.  Concern for PTA versus RPA.  Will collect labs to include CBC, BMP, viral panel, mononucleosis screen, group A strep test, lactic, blood cultures, CT soft tissue neck with contrast.  Patient initially provided Tylenol for fever, 1 L fluid for tachycardia, 50 mcg of fentanyl for pain, for milligrams of Zofran for nausea, 10 mg of Decadron for swelling.  CBC shows leukocytosis of 25.9, no anemia.  Metabolic panel shows sodium 134, potassium 3.0 repleted with 40 mEq oral potassium, patient was able to swallow these without difficulty.  No elevated creatinine, anion gap 11.  Lactic acid 1.1.  Group A streptococcus, viral panel negative for all.  CT soft tissue neck shows no fluid collection however does show extensive tonsillitis.  Patient pulse rate has normalized at this time, no longer febrile.    Patient be started on  penicillin at this time.  Will start her on 500 mg twice daily x 10 days as well as steroid Dosepak.  Will also refer patient to ENT.  Patient advised to return to the ED with any new or worsening signs or symptoms such as increased swelling, trouble swallowing, trouble breathing.  She was advised to take Tylenol for fevers at home.  Patient voiced understanding of the instructions.  She is stable to discharge at this time.   Final Clinical Impression(s) / ED Diagnoses Final diagnoses:  Tonsillitis    Rx / DC Orders ED Discharge Orders          Ordered    penicillin v potassium (  VEETID) 500 MG tablet  2 times daily        07/10/23 1711    predniSONE (STERAPRED UNI-PAK 21 TAB) 10 MG (21) TBPK tablet  Daily        07/10/23 1711              Al Decant, PA-C 07/10/23 1711    Alvira Monday, MD 07/11/23 [phone]

## 2023-07-11 LAB — BLOOD CULTURE ID PANEL (REFLEXED) - BCID2

## 2023-07-11 NOTE — Telephone Encounter (Cosign Needed)
Patient had 1 set of blood cultures drawn at her last visit.  This was positive for gram-negative rods on the anaerobic blood draw. This seems to be a likely contaminant.  In shared decision-making with attending physician, Dr. Beckey Downing I contacted the patient to ask how she was feeling.  Patient states "Oh my God I am feeling so much better." I discussed the findings of her blood culture and suspect that this is likely contaminant.  In shared decision-making with the patient she agrees that she is feeling much better at this time and should she have any worsening will return for further evaluation.  I do not think that she needs to be seen for sepsis.  Patient is comfortable with the plan.

## 2023-07-12 ENCOUNTER — Ambulatory Visit: Payer: Medicaid Other | Admitting: Neurology

## 2023-07-12 ENCOUNTER — Encounter: Payer: Self-pay | Admitting: Neurology

## 2023-07-13 LAB — CULTURE, BLOOD (ROUTINE X 2): Special Requests: ADEQUATE

## 2023-07-14 ENCOUNTER — Other Ambulatory Visit: Payer: Self-pay

## 2023-07-14 ENCOUNTER — Emergency Department (HOSPITAL_COMMUNITY)
Admission: EM | Admit: 2023-07-14 | Discharge: 2023-07-14 | Disposition: A | Discharge status: Home / Self Care | Payer: Medicaid Other | Attending: Emergency Medicine | Admitting: Emergency Medicine

## 2023-07-14 ENCOUNTER — Emergency Department (HOSPITAL_COMMUNITY): Payer: Medicaid Other

## 2023-07-14 ENCOUNTER — Encounter (HOSPITAL_COMMUNITY): Payer: Self-pay

## 2023-07-14 ENCOUNTER — Telehealth (HOSPITAL_COMMUNITY): Payer: Self-pay

## 2023-07-14 DIAGNOSIS — A498 Other bacterial infections of unspecified site: Secondary | ICD-10-CM

## 2023-07-14 DIAGNOSIS — R509 Fever, unspecified: Secondary | ICD-10-CM | POA: Diagnosis present

## 2023-07-14 DIAGNOSIS — J984 Other disorders of lung: Secondary | ICD-10-CM | POA: Insufficient documentation

## 2023-07-14 DIAGNOSIS — J850 Gangrene and necrosis of lung: Secondary | ICD-10-CM | POA: Diagnosis not present

## 2023-07-14 DIAGNOSIS — J039 Acute tonsillitis, unspecified: Secondary | ICD-10-CM | POA: Insufficient documentation

## 2023-07-14 DIAGNOSIS — R7881 Bacteremia: Secondary | ICD-10-CM

## 2023-07-14 DIAGNOSIS — J189 Pneumonia, unspecified organism: Secondary | ICD-10-CM | POA: Insufficient documentation

## 2023-07-14 LAB — TROPONIN I (HIGH SENSITIVITY)
Troponin I (High Sensitivity): 6 ng/L (ref <18)
Troponin I (High Sensitivity): 3 ng/L (ref <18)

## 2023-07-14 LAB — BASIC METABOLIC PANEL
Anion gap: 11 (ref 5–15)
BUN: 8 mg/dL (ref 6–20)
CO2: 27 mmol/L (ref 22–32)
Calcium: 8.2 mg/dL — ABNORMAL LOW (ref 8.9–10.3)
Chloride: 99 mmol/L (ref 98–111)
Creatinine, Ser: 0.73 mg/dL (ref 0.44–1.00)
GFR, Estimated: >60 mL/min (ref >60)
Glucose, Bld: 146 mg/dL — ABNORMAL HIGH (ref 70–99)
Potassium: 3 mmol/L — ABNORMAL LOW (ref 3.5–5.1)
Sodium: 137 mmol/L (ref 135–145)

## 2023-07-14 LAB — CBC
HCT: 36.3 % (ref 36.0–46.0)
Hemoglobin: 12 g/dL (ref 12.0–15.0)
MCH: 28.9 pg (ref 26.0–34.0)
MCHC: 33.1 g/dL (ref 30.0–36.0)
MCV: 87.5 fL (ref 80.0–100.0)
Platelets: 363 10*3/uL (ref 150–400)
RBC: 4.15 MIL/uL (ref 3.87–5.11)
RDW: 13.2 % (ref 11.5–15.5)
WBC: 32.8 10*3/uL — ABNORMAL HIGH (ref 4.0–10.5)
nRBC: 0 % (ref 0.0–0.2)

## 2023-07-14 MED ORDER — SODIUM CHLORIDE 0.9 % IV SOLN
3.0000 g | Freq: Once | INTRAVENOUS | Status: AC
  Administered 2023-07-14: 3 g via INTRAVENOUS
  Filled 2023-07-14: qty 8

## 2023-07-14 MED ORDER — IOHEXOL 350 MG/ML SOLN
75.0000 mL | Freq: Once | INTRAVENOUS | Status: AC | PRN
  Administered 2023-07-14: 75 mL via INTRAVENOUS

## 2023-07-14 MED ORDER — AMOXICILLIN-POT CLAVULANATE 875-125 MG PO TABS: 1.0000 | ORAL_TABLET | Freq: Two times a day (BID) | ORAL | 0 refills | Status: AC

## 2023-07-14 NOTE — Discharge Instructions (Signed)
You are seen in the emergency room for bacteria growing in your bloodstream and concerns for complication from your tonsillitis.  The CT scan does confirm severe pneumonia called necrotizing pneumonia. Fortunately, rest of your workup is overall reassuring.  Initial recommendation was for admission to the hospital, but because of your social circumstances, ID team is comfortable discharging you with outpatient follow-up next week.  Please start taking the antibiotics that are prescribed and finish the entire course.  Return to the emergency room if you start having worsening chest pain, shortness of breath, fevers, chills severe weakness and nausea. vomiting.  As discussed, your blood pressure was running slightly high in the emergency room.  Please ensure that you get your blood pressure checked by PCP as well.

## 2023-07-14 NOTE — ED Provider Notes (Signed)
[place] EMERGENCY DEPARTMENT AT [facility] Provider Note   CSN: [id number] Arrival date & time: 07/14/23  1048     History  Chief Complaint  Patient presents with   Abnormal Lab   Chest Pain    Tracie Valenzuela is a 37 y.o. female.  HPI     37 year old female comes in with chief complaint of abnormal blood culture.  Patient has history of seizure disorders.  She was seen on 9-1 in the emergency room.  Patient had come in complaining of severe throat pain with fevers and chills.  Patient was found to have tonsillitis.  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  She had a CT soft tissue neck with contrast completed during that encounter that revealed no evidence of thrombosis.  However, ID team has reviewed the positive blood cultures and recommended that patient come to the emergency room.  Additionally there was a pulmonary nodule noted on the CT soft tissue neck -heightening the possibility of septic emboli.  Patient indicates that she has been taking her Pen-Vee K as prescribed along with the prednisone. She is feeling a lot better.  She is swallowing well.  She is no longer having fevers, chills.  She is noticing some chest discomfort below her breast, typically when she coughs.  Home Medications Prior to Admission medications   Medication Sig Start Date End Date Taking? Authorizing Provider  amoxicillin-clavulanate (AUGMENTIN) 875-125 MG tablet Take 1 tablet by mouth 2 (two) times daily for 28 days. 07/14/23 08/11/23 Yes Derwood Kaplan, MD  Chlorphen-Phenyleph-Ibuprofen (ADVIL MULTI-SYMPTOM COLD & FLU PO) Take 1-2 tablets by mouth as needed (cold symptoms).   Yes [provider]  Midazolam (NAYZILAM) 5 MG/0.1ML SOLN Place 5 mg into the nose as needed (For seizure lasting more than 2 minutes). 02/01/23  Yes Camara, Amalia Hailey, MD  predniSONE (STERAPRED UNI-PAK 21 TAB) 10 MG (21) TBPK tablet Take by mouth daily. Take 6 tabs by mouth daily  for 2  days, then 5 tabs for 2 days, then 4 tabs for 2 days, then 3 tabs for 2 days, 2 tabs for 2 days, then 1 tab by mouth daily for 2 days 07/10/23  Yes Karie Mainland, Amjad, PA-C  levETIRAcetam (KEPPRA) 500 MG tablet Take 1 tablet (500 mg total) by mouth 2 (two) times daily. Patient not taking: Reported on 07/14/2023 06/23/23   Horton, Mayer Masker, MD      Allergies    Patient has no known allergies.    Review of Systems   Review of Systems  All other systems reviewed and are negative.   Physical Exam Updated Vital Signs BP (!) 205/119   Pulse 65   Temp 98 F (36.7 C) (Oral)   Resp (!) 22   Ht 5\' 10"  (1.778 m)   Wt 89.4 kg   LMP 06/25/2023   SpO2 98%   BMI 28.27 kg/m  Physical Exam Vitals and nursing note reviewed.  Constitutional:      Appearance: She is well-developed.  HENT:     Head: Atraumatic.  Cardiovascular:     Rate and Rhythm: Normal rate.     Heart sounds: Normal heart sounds. No murmur heard. Pulmonary:     Effort: Pulmonary effort is normal.     Breath sounds: Normal breath sounds.  Musculoskeletal:     Cervical back: Normal range of motion and neck supple.  Skin:    General: Skin is warm and dry.  Neurological:     Mental Status: She is  alert and oriented to person, place, and time.     ED Results / Procedures / Treatments   Labs (all labs ordered are listed, but only abnormal results are displayed) Labs Reviewed  BASIC METABOLIC PANEL - Abnormal; Notable for the following components:      Result Value   Potassium 3.0 (*)    Glucose, Bld 146 (*)    Calcium 8.2 (*)    All other components within normal limits  CBC - Abnormal; Notable for the following components:   WBC 32.8 (*)    All other components within normal limits  CULTURE, BLOOD (ROUTINE X 2)  CULTURE, BLOOD (ROUTINE X 2)  TROPONIN I (HIGH SENSITIVITY)  TROPONIN I (HIGH SENSITIVITY)    EKG EKG Interpretation Date/Time:  Thursday July 14 2023 11:47:50 EDT Ventricular Rate:  72 PR  Interval:  160 QRS Duration:  94 QT Interval:  387 QTC Calculation: 424 R Axis:   72  Text Interpretation: Sinus rhythm No acute changes No significant change since last tracing Confirmed by Derwood Kaplan [phone]) on 07/14/2023 3:02:11 PM  Radiology CT Soft Tissue Neck W Contrast  Result Date: 07/14/2023 CLINICAL DATA:  Provided history: Soft tissue infection suspected, neck, x-ray done. EXAM: CT NECK WITH CONTRAST TECHNIQUE: Multidetector CT imaging of the neck was performed using the standard protocol following the bolus administration of intravenous contrast. RADIATION DOSE REDUCTION: This exam was performed according to the departmental dose-optimization program which includes automated exposure control, adjustment of the mA and/or kV according to patient size and/or use of iterative reconstruction technique. CONTRAST:  75mL OMNIPAQUE IOHEXOL 350 MG/ML SOLN COMPARISON:  Head CT 04/30/2022. FINDINGS: Mild-to-moderately motion degraded examination, greatest at the level of the pharynx and upper neck. Within this limitation, findings are as follows. Pharynx and larynx: Symmetric prominence of the palatine tonsils. No evidence of a tonsillar/peritonsillar abscess. No significant airway effacement. No appreciable swelling or mass elsewhere within the oral cavity, pharynx or larynx. No retropharyngeal collection. Salivary glands: No inflammation, mass, or stone. Thyroid: Unremarkable. Lymph nodes: Mildly enlarged level II lymph nodes bilaterally (measuring up to 13 mm in short axis). Vascular: The major vascular structures of the neck are patent. Limited intracranial: No evidence of an acute intracranial abnormality within the field of view. Visualized orbits: Redemonstrated chronic medially displaced fracture deformity of the right lamina papyracea. Mastoids and visualized paranasal sinuses: No significant paranasal sinus disease or mastoid effusion. Skeleton: No acute fracture or aggressive osseous lesion.  Dextrocurvature of the cervical spine. Partially imaged levocurvature of the upper/mid thoracic spine. Upper chest: Reported separately on same day chest CT. IMPRESSION: 1. Motion degraded examination. 2. Symmetric prominence of the palatine tonsils compatible with acute tonsillitis/pharyngitis in the appropriate clinical setting. No evidence of a tonsillar or peritonsillar abscess. No significant airway effacement. 3. Mildly enlarged level 2 cervical lymph nodes bilaterally, likely reactive. Electronically Signed   By: Jackey Loge D.O.   On: 07/14/2023 15:49   CT Angio Chest PE W and/or Wo Contrast  Result Date: 07/14/2023 CLINICAL DATA:  Pain/discomfort beneath the right breast. EXAM: CT ANGIOGRAPHY CHEST WITH CONTRAST TECHNIQUE: Multidetector CT imaging of the chest was performed using the standard protocol during bolus administration of intravenous contrast. Multiplanar CT image reconstructions and MIPs were obtained to evaluate the vascular anatomy. RADIATION DOSE REDUCTION: This exam was performed according to the departmental dose-optimization program which includes automated exposure control, adjustment of the mA and/or kV according to patient size and/or use of iterative reconstruction technique. CONTRAST:  75mL OMNIPAQUE IOHEXOL 350 MG/ML SOLN COMPARISON:  None Available. FINDINGS: Cardiovascular: The thoracic aorta is normal in appearance. Satisfactory opacification of the pulmonary arteries to the segmental level. No evidence of pulmonary embolism. Normal heart size. No pericardial effusion. Mediastinum/Nodes: No enlarged mediastinal, hilar, or axillary lymph nodes. Thyroid gland, trachea, and esophagus demonstrate no significant findings. Lungs/Pleura: Moderate severity right middle lobe infiltrate is seen. An adjacent 9 mm x 5 mm cavitary appearing component is seen along the inferior aspect of the anterior right middle lobe (axial CT image 89/CT series 2, sagittal reformatted image 44/CT series  8). Mild posterior right basilar atelectasis and/or infiltrate is present. An 11 mm pleural based, nodular appearing area of low attenuation is seen within the posterolateral aspect of the right lower lobe (axial CT image 89, CT series 12). No pleural effusion or pneumothorax is identified. Upper Abdomen: No acute abnormality. Musculoskeletal: No chest wall abnormality. No acute or significant osseous findings. Review of the MIP images confirms the above findings. IMPRESSION: 1. Moderate severity right middle lobe infiltrate with an adjacent 9 mm x 5 mm cavitary appearing component. Follow-up to resolution is recommended, as sequelae associated with an underlying neoplastic process cannot be excluded. 2. Mild posterior right basilar atelectasis and/or infiltrate. 3. 11 mm pleural based, right lower lobe nodular appearing area, as described above. Consider one of the following in 3 months for both low-risk and high-risk individuals: (a) repeat chest CT, (b) follow-up PET-CT, or (c) tissue sampling. This recommendation follows the consensus statement: Guidelines for Management of Incidental Pulmonary Nodules Detected on CT Images: From the Fleischner Society 2017; Radiology 2017; 284:228-243. 4. No evidence of pulmonary embolism. Electronically Signed   By: Aram Candela M.D.   On: 07/14/2023 15:38   DG Chest 2 View  Result Date: 07/14/2023 CLINICAL DATA:  Pain beneath right breast EXAM: CHEST - 2 VIEW COMPARISON:  Chest radiograph 04/30/2022 FINDINGS: The cardiomediastinal silhouette is normal There is patchy airspace opacity in the right middle lobe. The lungs are otherwise clear. There is no pulmonary edema. There is no pleural effusion or pneumothorax There is no acute osseous abnormality. IMPRESSION: Right middle lobe pneumonia. Electronically Signed   By: Lesia Hausen M.D.   On: 07/14/2023 12:25    Procedures .Critical Care  Performed by: Derwood Kaplan, MD Authorized by: Derwood Kaplan, MD    Critical care provider statement:    Critical care time (minutes):  32   Critical care was necessary to treat or prevent imminent or life-threatening deterioration of the following conditions: Bacteremia, cavitary pneumonia.   Critical care was time spent personally by me on the following activities:  Development of treatment plan with patient or surrogate, discussions with consultants, evaluation of patient's response to treatment, examination of patient, ordering and review of laboratory studies, ordering and review of radiographic studies, ordering and performing treatments and interventions, pulse oximetry, re-evaluation of patient's condition and review of old charts     Medications Ordered in ED Medications  Ampicillin-Sulbactam (UNASYN) 3 g in sodium chloride 0.9 % 100 mL IVPB (0 g Intravenous Stopped 07/14/23 1421)  iohexol (OMNIPAQUE) 350 MG/ML injection 75 mL (75 mLs Intravenous Contrast Given 07/14/23 1416)    ED Course/ Medical Decision Making/ A&P                                 Medical Decision Making Amount and/or Complexity of Data Reviewed Labs: ordered. Radiology: ordered.  Risk Prescription drug management. Decision regarding hospitalization.   This patient presents to the ED with chief complaint(s) of abnormal labs with pertinent past medical history of seizure disorder.  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 The complaint involves an extensive differential diagnosis and also carries with it a high risk of complications and morbidity.    The differential diagnosis includes : Bacteremia, septic pulmonary emboli, endocarditis, pneumonia.  Patient indicates that her throat pain is a lot better.  She is not having systemic symptoms like she was.  However, she does have a cough and there is some chest discomfort on the right side when  she does cough.  Low overall suspicion for RPA, PTA.  The initial plan is to get basic labs, CT soft tissue neck and CT chest with contrast.  Additional history obtained: Records reviewed  notes from ID team that have reviewed the case and advised patient to come to the ER .  I also reviewed the CT soft tissue neck with contrast that was completed on 9-1 from the ER.  There was no evidence of IVC thrombosis or deep space infection of the throat.  Independent labs interpretation:  The following labs were independently interpreted: Patient has elevated white count, white count over 30,000.  She is on prednisone, which could be contributing.  Independent visualization and interpretation of imaging: - I independently visualized the following imaging with scope of interpretation limited to determining acute life threatening conditions related to emergency care: , which revealed CT scan that shows concerns for pneumonia.  Per radiologist, the lesion appears cavitary nature.  Consultation: - Consulted or discussed management/test interpretation with external professional: Dr. Ilsa Iha, infectious disease.  She does think it is a good idea to get CT neck along with CT chest with contrast in this situation.  5:12 PM ID team is seen the patient.  Patient is resistant towards admission. I spoke with Dr. Ilsa Iha again after she reviewed the imaging.  Dr. Ilsa Iha is comfortable with patient going home with Augmentin twice daily for 28 days and for her to return to the ER if her symptoms get worse.  Patient is comfortable with this plan and prefers it due to childcare issues.  Return precautions discussed. Patient has blood pressure in the 160s range.  She states that she has been crying and nervous.  She will follow-up with PCP for her blood pressure.  Final Clinical Impression(s) / ED Diagnoses Final diagnoses:  Bacteremia  Tonsillitis  Pulmonary cavitary lesion  Cavitary pneumonia    Rx / DC  Orders ED Discharge Orders          Ordered    amoxicillin-clavulanate (AUGMENTIN) 875-125 MG tablet  2 times daily        07/14/23 1708              Derwood Kaplan, MD 07/14/23 1713

## 2023-07-14 NOTE — Telephone Encounter (Signed)
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 Though no IJV thrombosis was seen on 9/1 imaging, the nodular findings in the lungs are concerning given the positive BCx result of fusobacterium and the patients presentation.   This case was discussed with the ID provide Judyann Munson who agreed with calling the patient back in for further evaluation  Contacted the patient this AM and she is agreeable to return to the ED for further evaluation.   Georgina Pillion, PharmD, BCPS, BCIDP Infectious Diseases Clinical Pharmacist 07/14/2023 9:03 AM

## 2023-07-14 NOTE — ED Triage Notes (Signed)
Pt was seen on 07/10/2023 for throat issues. Pt discharged with abx prescription, states that the issue in her throat was improving. Pt received a call today, stating that they had something positive in her blood work, and she needs to return for further treatment. Pt does not know exactly what blood work. Pt also complaining of pain/discomfort beneath her right breast. Pt states the discomfort increases with movement/position.

## 2023-07-14 NOTE — Progress Notes (Signed)
ED Antimicrobial Stewardship Positive Culture Follow Up   Tracie Valenzuela is an 37 y.o. female who presented to [facility] on 07/10/2023 with a chief complaint of  Chief Complaint  Patient presents with   Fever   Emesis   Nausea    Recent Results (from the past 720 hour(s))  Resp panel by RT-PCR (RSV, Flu A&B, Covid) Anterior Nasal Swab     Status: None   Collection Time: 07/07/23  7:46 AM   Specimen: Anterior Nasal Swab  Result Value Ref Range Status   SARS Coronavirus 2 by RT PCR NEGATIVE NEGATIVE Final    Comment: (NOTE) SARS-CoV-2 target nucleic acids are NOT DETECTED.  The SARS-CoV-2 RNA is generally detectable in upper respiratory specimens during the acute phase of infection. The lowest concentration of SARS-CoV-2 viral copies this assay can detect is 138 copies/mL. A negative result does not preclude SARS-Cov-2 infection and should not be used as the sole basis for treatment or other patient management decisions. A negative result may occur with  improper specimen collection/handling, submission of specimen other than nasopharyngeal swab, presence of viral mutation(s) within the areas targeted by this assay, and inadequate number of viral copies(<138 copies/mL). A negative result must be combined with clinical observations, patient history, and epidemiological information. The expected result is Negative.  Fact Sheet for Patients:  BloggerCourse.com  Fact Sheet for Healthcare Providers:  SeriousBroker.it  This test is no t yet approved or cleared by the [place] FDA and  has been authorized for detection and/or diagnosis of SARS-CoV-2 by FDA under an Emergency Use Authorization (EUA). This EUA will remain  in effect (meaning this test can be used) for the duration of the COVID-19 declaration under Section 564(b)(1) of the Act, 21 U.S.C.section 360bbb-3(b)(1), unless the authorization is terminated  or revoked  sooner.       Influenza A by PCR NEGATIVE NEGATIVE Final   Influenza B by PCR NEGATIVE NEGATIVE Final    Comment: (NOTE) The Xpert Xpress SARS-CoV-2/FLU/RSV plus assay is intended as an aid in the diagnosis of influenza from Nasopharyngeal swab specimens and should not be used as a sole basis for treatment. Nasal washings and aspirates are unacceptable for Xpert Xpress SARS-CoV-2/FLU/RSV testing.  Fact Sheet for Patients: BloggerCourse.com  Fact Sheet for Healthcare Providers: SeriousBroker.it  This test is not yet approved or cleared by the [place] FDA and has been authorized for detection and/or diagnosis of SARS-CoV-2 by FDA under an Emergency Use Authorization (EUA). This EUA will remain in effect (meaning this test can be used) for the duration of the COVID-19 declaration under Section 564(b)(1) of the Act, 21 U.S.C. section 360bbb-3(b)(1), unless the authorization is terminated or revoked.     Resp Syncytial Virus by PCR NEGATIVE NEGATIVE Final    Comment: (NOTE) Fact Sheet for Patients: BloggerCourse.com  Fact Sheet for Healthcare Providers: SeriousBroker.it  This test is not yet approved or cleared by the [place] FDA and has been authorized for detection and/or diagnosis of SARS-CoV-2 by FDA under an Emergency Use Authorization (EUA). This EUA will remain in effect (meaning this test can be used) for the duration of the COVID-19 declaration under Section 564(b)(1) of the Act, 21 U.S.C. section 360bbb-3(b)(1), unless the authorization is terminated or revoked.  Performed at [facility], [address], [place], [place] [address]   Group A Strep by PCR     Status: None   Collection Time: 07/07/23  7:46 AM   Specimen: Anterior Nasal Swab;  Sterile Swab  Result Value Ref Range Status   Group A Strep by PCR NOT DETECTED NOT DETECTED  Final    Comment: Performed at [facility], [address] [place], [place] [address]  Blood culture (routine x 2)     Status: Abnormal   Collection Time: 07/10/23  1:01 PM   Specimen: BLOOD  Result Value Ref Range Status   Specimen Description   Final    BLOOD RIGHT ANTECUBITAL Performed at [facility], [address], [place], [place] [address]    Special Requests   Final    BOTTLES DRAWN AEROBIC AND ANAEROBIC Blood Culture adequate volume Performed at [facility], [address], [place], [place] [address]    Culture  Setup Time   Final    GRAM NEGATIVE RODS ANAEROBIC BOTTLE ONLY CRITICAL RESULT CALLED TO, READ BACK BY AND VERIFIED WITH: RN PATTY DOWD @ 07/11/23 @ 1611 BY DRT    Culture (A)  Final    FUSOBACTERIUM NECROPHORUM BETA LACTAMASE NEGATIVE Performed at [facility], [address], [place], [place] [address]    Report Status 07/13/2023 FINAL  Final  Blood Culture ID Panel (Reflexed)     Status: None   Collection Time: 07/10/23  1:01 PM  Result Value Ref Range Status   Enterococcus faecalis NOT DETECTED NOT DETECTED Final   Enterococcus Faecium NOT DETECTED NOT DETECTED Final   Listeria monocytogenes NOT DETECTED NOT DETECTED Final   Staphylococcus species NOT DETECTED NOT DETECTED Final   Staphylococcus aureus (BCID) NOT DETECTED NOT DETECTED Final   Staphylococcus epidermidis NOT DETECTED NOT DETECTED Final   Staphylococcus lugdunensis NOT DETECTED NOT DETECTED Final   Streptococcus species NOT DETECTED NOT DETECTED Final   Streptococcus agalactiae NOT DETECTED NOT DETECTED Final   Streptococcus pneumoniae NOT DETECTED NOT DETECTED Final   Streptococcus pyogenes NOT DETECTED NOT DETECTED Final   A.calcoaceticus-baumannii NOT DETECTED NOT DETECTED Final   Bacteroides fragilis NOT DETECTED NOT DETECTED Final   Enterobacterales NOT DETECTED NOT DETECTED Final   Enterobacter cloacae complex NOT DETECTED NOT  DETECTED Final   Escherichia coli NOT DETECTED NOT DETECTED Final   Klebsiella aerogenes NOT DETECTED NOT DETECTED Final   Klebsiella oxytoca NOT DETECTED NOT DETECTED Final   Klebsiella pneumoniae NOT DETECTED NOT DETECTED Final   Proteus species NOT DETECTED NOT DETECTED Final   Salmonella species NOT DETECTED NOT DETECTED Final   Serratia marcescens NOT DETECTED NOT DETECTED Final   Haemophilus influenzae NOT DETECTED NOT DETECTED Final   Neisseria meningitidis NOT DETECTED NOT DETECTED Final   Pseudomonas aeruginosa NOT DETECTED NOT DETECTED Final   Stenotrophomonas maltophilia NOT DETECTED NOT DETECTED Final   Candida albicans NOT DETECTED NOT DETECTED Final   Candida auris NOT DETECTED NOT DETECTED Final   Candida glabrata NOT DETECTED NOT DETECTED Final   Candida krusei NOT DETECTED NOT DETECTED Final   Candida parapsilosis NOT DETECTED NOT DETECTED Final   Candida tropicalis NOT DETECTED NOT DETECTED Final   Cryptococcus neoformans/gattii NOT DETECTED NOT DETECTED Final    Comment: Performed at [facility], [address], [place], [place] [address]  Resp panel by RT-PCR (RSV, Flu A&B, Covid) Anterior Nasal Swab     Status: None   Collection Time: 07/10/23  1:14 PM   Specimen: Anterior Nasal Swab  Result Value Ref Range Status   SARS Coronavirus 2 by RT PCR NEGATIVE NEGATIVE Final    Comment: (NOTE) SARS-CoV-2 target nucleic  acids are NOT DETECTED.  The SARS-CoV-2 RNA is generally detectable in upper respiratory specimens during the acute phase of infection. The lowest concentration of SARS-CoV-2 viral copies this assay can detect is 138 copies/mL. A negative result does not preclude SARS-Cov-2 infection and should not be used as the sole basis for treatment or other patient management decisions. A negative result may occur with  improper specimen collection/handling, submission of specimen other than nasopharyngeal swab, presence of viral mutation(s) within  the areas targeted by this assay, and inadequate number of viral copies(<138 copies/mL). A negative result must be combined with clinical observations, patient history, and epidemiological information. The expected result is Negative.  Fact Sheet for Patients:  BloggerCourse.com  Fact Sheet for Healthcare Providers:  SeriousBroker.it  This test is no t yet approved or cleared by the [place] FDA and  has been authorized for detection and/or diagnosis of SARS-CoV-2 by FDA under an Emergency Use Authorization (EUA). This EUA will remain  in effect (meaning this test can be used) for the duration of the COVID-19 declaration under Section 564(b)(1) of the Act, 21 U.S.C.section 360bbb-3(b)(1), unless the authorization is terminated  or revoked sooner.       Influenza A by PCR NEGATIVE NEGATIVE Final   Influenza B by PCR NEGATIVE NEGATIVE Final    Comment: (NOTE) The Xpert Xpress SARS-CoV-2/FLU/RSV plus assay is intended as an aid in the diagnosis of influenza from Nasopharyngeal swab specimens and should not be used as a sole basis for treatment. Nasal washings and aspirates are unacceptable for Xpert Xpress SARS-CoV-2/FLU/RSV testing.  Fact Sheet for Patients: BloggerCourse.com  Fact Sheet for Healthcare Providers: SeriousBroker.it  This test is not yet approved or cleared by the [place] FDA and has been authorized for detection and/or diagnosis of SARS-CoV-2 by FDA under an Emergency Use Authorization (EUA). This EUA will remain in effect (meaning this test can be used) for the duration of the COVID-19 declaration under Section 564(b)(1) of the Act, 21 U.S.C. section 360bbb-3(b)(1), unless the authorization is terminated or revoked.     Resp Syncytial Virus by PCR NEGATIVE NEGATIVE Final    Comment: (NOTE) Fact Sheet for  Patients: BloggerCourse.com  Fact Sheet for Healthcare Providers: SeriousBroker.it  This test is not yet approved or cleared by the [place] FDA and has been authorized for detection and/or diagnosis of SARS-CoV-2 by FDA under an Emergency Use Authorization (EUA). This EUA will remain in effect (meaning this test can be used) for the duration of the COVID-19 declaration under Section 564(b)(1) of the Act, 21 U.S.C. section 360bbb-3(b)(1), unless the authorization is terminated or revoked.  Performed at [facility], [address], [place], [place] [address]   Group A Strep by PCR     Status: None   Collection Time: 07/10/23  2:40 PM   Specimen: Throat; Sterile Swab  Result Value Ref Range Status   Group A Strep by PCR NOT DETECTED NOT DETECTED Final    Comment: Performed at [facility], [address] [place], [place] [address]    [x]  Needs additional follow-up  66 YOF who originally presented 8/29 with sore throat/phlegm, + fevers, no CBC done, neg Flu/COVID/RSV/rapid strep, dx viral pharyngitis and sent home. Represented 9/1 with persistent sx, temp 101.1, WBC up to 25.9, neck CT did NOT show IJV thrombus but did show tonsillitis w/ edema, prominent upper cervical chain lymph nodes, and multiple nodular opacities in the L-lung apex. She was sent out on PCN  VK 500 mg po bid x 10d  1 blood culture set was drawn on 9/1 which was called as an anaerobic GNR - at which time the patient was called for a symptom check and was stated to have an improvement in symptoms. 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 Though no IJV thrombosis was seen on 9/1 imaging, the nodular findings in the lungs are concerning given the positive BCx result  of fusobacterium.  This case was discussed with the ID provide Judyann Munson who agreed with calling the patient back in for further evaluation  Needs additional follow-up: Call the patient back for re-evaluation, repeat blood cultures, possible re-imaging to rule out IJV thrombus or pulmonary findings. Can be started empirically on ampicillin/sulbactam 3g IV every 6 hours while awaiting further work-up  The patient was already contacted via phone by myself at 0845 and she is agreeable to return to the ED for further evaluation.   Provider: Judyann Munson, MD  Thank you for allowing pharmacy to be a part of this patient's care.  Georgina Pillion, PharmD, BCPS, BCIDP Infectious Diseases Clinical Pharmacist 07/14/2023 8:46 AM   **Pharmacist phone directory can now be found on [email or web address] ([email or web address]).  Listed under [facility].

## 2023-07-14 NOTE — Consult Note (Signed)
[facility]  Total days of antibiotics 5(pen vk)  Reason for Consult: fusobacterium bacteremia   Referring Physician: nanavati  Active Problems:   * No active hospital problems. *    HPI: Tracie Valenzuela is a 37 y.o. female with hx of seizure disorder, not on AE, who initially presented to the ED on 8/29 with 1 day history of sore throat and severe pain where she had difficulty swallowing with low grade fever. On exam, she had tonsillar exudate and cervical adenopathy. She was given dexamenthasone and lidocaine mouth wash, and thought this was viral pharyngitis. She returns to the Ed on 9/1 reporting ongoing ST, fevers, chills, myalgias. With nausea and vomiting. Exam still showing tonsillar exudate. Labs revealed leukocytosis of 26K. CT imaging of neck showed lymphadenopathy but not abscess,- she was discharged on penVK, as well as steroid dose pack. 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 She was asked to come back to the ED for further evaluation and rule out lemierre's syndrome.  She reports that her sore throat is improved, no longer having swelling or exudate. No fevers. She is feeling better with exception to right sided pleurisy on deep breath.  Repeat ct of neck -tonsillitis, some reactive cervical LN, and ct of chest showing RML infiltrate with 9 mm x 5mm cavitary appearance. 1mm pleural based node also noted.  Labs noteable for wbc of 32.8 (but she has been taking steroid pack) Past Medical History:  Diagnosis Date   Medical history non-contributory    Seizures (HCC)     Allergies: No Known Allergies   MEDICATIONS:   Social History   Tobacco Use   Smoking status: Never   Smokeless tobacco: Never  Vaping Use   Vaping status: Never Used  Substance Use Topics   Alcohol use: Yes    Alcohol/week: 14.0 standard drinks of alcohol    Types: 14 Cans of beer  per week   Drug use: Yes    Comment: cannabis, cocaine hx    Family History  Problem Relation Age of Onset   Hypertension Father    Diabetes Father    Hypertension Maternal Aunt    Diabetes Maternal Aunt    Hypertension Maternal Uncle    Diabetes Maternal Uncle    Hypertension Paternal Aunt    Diabetes Paternal Aunt    Hypertension Paternal Uncle    Diabetes Paternal Uncle    Hypertension Maternal Grandmother    Diabetes Maternal Grandmother    Hypertension Paternal Grandmother    Diabetes Paternal Grandmother    Sochx: works in the evenings at [employer] center. She has a 69yr and 72yr kids that she cares for. Does not want to stay hospitalized due to childcare limitations  Review of Systems - 12 point ros is negative except for what is mentioned above   OBJECTIVE: Temp:  [97.7 F (36.5 C)-98 F (36.7 C)] 98 F (36.7 C) (09/05 1442) Pulse Rate:  [65-93] 65 (09/05 1600) Resp:  [15-22] 22 (09/05 1600) BP: (137-205)/(93-119) 205/119 (09/05 1600) SpO2:  [97 %-100 %] 98 % (09/05 1600) Weight:  [89.4 kg] 89.4 kg (09/05 1055) Physical Exam  Constitutional:  oriented to person, place, and time. appears well-developed and well-nourished. No distress.  HENT: [place]/AT, PERRLA, no scleral icterus Mouth/Throat: Oropharynx is clear and moist. No oropharyngeal exudate. Slight posterior pharynx erythema Cardiovascular: Normal rate, regular rhythm and normal heart sounds. Exam reveals no  gallop and no friction rub.  No murmur heard.  Pulmonary/Chest: Effort normal and breath sounds normal. No respiratory distress.  has no wheezes.  Neck = supple, no nuchal rigidity Abdominal: Soft. Bowel sounds are normal.  exhibits no distension. There is no tenderness.  Lymphadenopathy: + cervical adenopathy. No axillary adenopathy Neurological: alert and oriented to person, place, and time.  Skin: Skin is warm and dry. No rash noted. No erythema.  Psychiatric: a normal mood and affect.   behavior is normal.    LABS: Results for orders placed or performed during the hospital encounter of 07/14/23 (from the past 48 hour(s))  Basic metabolic panel     Status: Abnormal   Collection Time: 07/14/23 11:36 AM  Result Value Ref Range   Sodium 137 135 - 145 mmol/L   Potassium 3.0 (L) 3.5 - 5.1 mmol/L   Chloride 99 98 - 111 mmol/L   CO2 27 22 - 32 mmol/L   Glucose, Bld 146 (H) 70 - 99 mg/dL    Comment: Glucose reference range applies only to samples taken after fasting for at least 8 hours.   BUN 8 6 - 20 mg/dL   Creatinine, Ser 1.61 0.44 - 1.00 mg/dL   Calcium 8.2 (L) 8.9 - 10.3 mg/dL   GFR, Estimated >09 >60 mL/min    Comment: (NOTE) Calculated using the CKD-EPI Creatinine Equation (2021)    Anion gap 11 5 - 15    Comment: Performed at [facility], [address], [place], [place] [address]  CBC     Status: Abnormal   Collection Time: 07/14/23 11:36 AM  Result Value Ref Range   WBC 32.8 (H) 4.0 - 10.5 K/uL   RBC 4.15 3.87 - 5.11 MIL/uL   Hemoglobin 12.0 12.0 - 15.0 g/dL   HCT 81.1 91.4 - 78.2 %   MCV 87.5 80.0 - 100.0 fL   MCH 28.9 26.0 - 34.0 pg   MCHC 33.1 30.0 - 36.0 g/dL   RDW 95.6 21.3 - 08.6 %   Platelets 363 150 - 400 K/uL   nRBC 0.0 0.0 - 0.2 %    Comment: Performed at [facility] [facility], [address], [place], [place] [address]  Troponin I (High Sensitivity)     Status: None   Collection Time: 07/14/23 11:36 AM  Result Value Ref Range   Troponin I (High Sensitivity) 6 <18 ng/L    Comment: (NOTE) Elevated high sensitivity troponin I (hsTnI) values and significant  changes across serial measurements may suggest ACS but many other  chronic and acute conditions are known to elevate hsTnI results.  Refer to the "Links" section for chest pain algorithms and additional  guidance. Performed at [facility], [address] [place], [place] [address]   Troponin I (High Sensitivity)     Status: None    Collection Time: 07/14/23  1:51 PM  Result Value Ref Range   Troponin I (High Sensitivity) 3 <18 ng/L    Comment: (NOTE) Elevated high sensitivity troponin I (hsTnI) values and significant  changes across serial measurements may suggest ACS but many other  chronic and acute conditions are known to elevate hsTnI results.  Refer to the "Links" section for chest pain algorithms and additional  guidance. Performed at [facility], [address], [place], [place] [address]     MICRO: Reviewed- new set of blood cx are pending IMAGING: CT Soft Tissue Neck W Contrast  Result Date: 07/14/2023 CLINICAL DATA:  Provided history: Soft tissue infection suspected, neck, x-ray done. EXAM: CT NECK WITH CONTRAST TECHNIQUE: Multidetector CT imaging of the neck was performed using the standard protocol following the bolus administration of intravenous contrast. RADIATION DOSE REDUCTION: This exam was performed according to the departmental dose-optimization program which includes automated exposure control, adjustment of the mA and/or kV according to patient size and/or use of iterative reconstruction technique. CONTRAST:  75mL OMNIPAQUE IOHEXOL 350 MG/ML SOLN COMPARISON:  Head CT 04/30/2022. FINDINGS: Mild-to-moderately motion degraded examination, greatest at the level of the pharynx and upper neck. Within this limitation, findings are as follows. Pharynx and larynx: Symmetric prominence of the palatine tonsils. No evidence of a tonsillar/peritonsillar abscess. No significant airway effacement. No appreciable swelling or mass elsewhere within the oral cavity, pharynx or larynx. No retropharyngeal collection. Salivary glands: No inflammation, mass, or stone. Thyroid: Unremarkable. Lymph nodes: Mildly enlarged level II lymph nodes bilaterally (measuring up to 13 mm in short axis). Vascular: The major vascular structures of the neck are patent. Limited intracranial: No evidence of an acute  intracranial abnormality within the field of view. Visualized orbits: Redemonstrated chronic medially displaced fracture deformity of the right lamina papyracea. Mastoids and visualized paranasal sinuses: No significant paranasal sinus disease or mastoid effusion. Skeleton: No acute fracture or aggressive osseous lesion. Dextrocurvature of the cervical spine. Partially imaged levocurvature of the upper/mid thoracic spine. Upper chest: Reported separately on same day chest CT. IMPRESSION: 1. Motion degraded examination. 2. Symmetric prominence of the palatine tonsils compatible with acute tonsillitis/pharyngitis in the appropriate clinical setting. No evidence of a tonsillar or peritonsillar abscess. No significant airway effacement. 3. Mildly enlarged level 2 cervical lymph nodes bilaterally, likely reactive. Electronically Signed   By: Jackey Loge D.O.   On: 07/14/2023 15:49   CT Angio Chest PE W and/or Wo Contrast  Result Date: 07/14/2023 CLINICAL DATA:  Pain/discomfort beneath the right breast. EXAM: CT ANGIOGRAPHY CHEST WITH CONTRAST TECHNIQUE: Multidetector CT imaging of the chest was performed using the standard protocol during bolus administration of intravenous contrast. Multiplanar CT image reconstructions and MIPs were obtained to evaluate the vascular anatomy. RADIATION DOSE REDUCTION: This exam was performed according to the departmental dose-optimization program which includes automated exposure control, adjustment of the mA and/or kV according to patient size and/or use of iterative reconstruction technique. CONTRAST:  75mL OMNIPAQUE IOHEXOL 350 MG/ML SOLN COMPARISON:  None Available. FINDINGS: Cardiovascular: The thoracic aorta is normal in appearance. Satisfactory opacification of the pulmonary arteries to the segmental level. No evidence of pulmonary embolism. Normal heart size. No pericardial effusion. Mediastinum/Nodes: No enlarged mediastinal, hilar, or axillary lymph nodes. Thyroid gland,  trachea, and esophagus demonstrate no significant findings. Lungs/Pleura: Moderate severity right middle lobe infiltrate is seen. An adjacent 9 mm x 5 mm cavitary appearing component is seen along the inferior aspect of the anterior right middle lobe (axial CT image 89/CT series 2, sagittal reformatted image 44/CT series 8). Mild posterior right basilar atelectasis and/or infiltrate is present. An 11 mm pleural based, nodular appearing area of low attenuation is seen within the posterolateral aspect of the right lower lobe (axial CT image 89, CT series 12). No pleural effusion or pneumothorax is identified. Upper Abdomen: No acute abnormality. Musculoskeletal: No chest wall abnormality. No acute or significant osseous findings. Review of the MIP images confirms the above findings. IMPRESSION: 1. Moderate severity right middle lobe infiltrate with an adjacent 9 mm x 5 mm cavitary appearing component. Follow-up to resolution is recommended, as sequelae associated with an  underlying neoplastic process cannot be excluded. 2. Mild posterior right basilar atelectasis and/or infiltrate. 3. 11 mm pleural based, right lower lobe nodular appearing area, as described above. Consider one of the following in 3 months for both low-risk and high-risk individuals: (a) repeat chest CT, (b) follow-up PET-CT, or (c) tissue sampling. This recommendation follows the consensus statement: Guidelines for Management of Incidental Pulmonary Nodules Detected on CT Images: From the Fleischner Society 2017; Radiology 2017; 284:228-243. 4. No evidence of pulmonary embolism. Electronically Signed   By: Aram Candela M.D.   On: 07/14/2023 15:38   DG Chest 2 View  Result Date: 07/14/2023 CLINICAL DATA:  Pain beneath right breast EXAM: CHEST - 2 VIEW COMPARISON:  Chest radiograph 04/30/2022 FINDINGS: The cardiomediastinal silhouette is normal There is patchy airspace opacity in the right middle lobe. The lungs are otherwise clear. There is no  pulmonary edema. There is no pleural effusion or pneumothorax There is no acute osseous abnormality. IMPRESSION: Right middle lobe pneumonia. Electronically Signed   By: Lesia Hausen M.D.   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 - she has received a dose of IV amp/sub but can change to amox/clav 875mg  PO BID x 4 wk - we will see her in the ID clinic next week, Scheduled 9/12 @ 1:45pm to see how she is improving. Repeat labs, CXR -will follow blood cx . Should they be positive ,will have to admit and then also do TTE/TEE and evaluate for other metastatic illness  I have personally spent 80 minutes involved in face-to-face and non-face-to-face activities for this patient on the day of the visit. Professional time spent includes the following activities: Preparing to see the patient (review of tests), Obtaining and/or reviewing separately obtained history (admission/discharge record), Performing a medically appropriate examination and/or evaluation , Ordering medications/tests/procedures, referring and communicating with other health care professionals, Documenting clinical information in the EMR, Independently interpreting results (not separately reported), Communicating results to the patient/family/caregiver, Counseling and educating the patient/family/caregiver and Care coordination (not separately reported).    Duke Salvia Drue Second MD MPH [facility] [phone]

## 2023-07-19 LAB — CULTURE, BLOOD (ROUTINE X 2)
Culture: NO GROWTH
Culture: NO GROWTH
Special Requests: ADEQUATE
Special Requests: ADEQUATE

## 2023-07-21 ENCOUNTER — Inpatient Hospital Stay: Payer: Medicaid Other | Admitting: Internal Medicine

## 2023-07-29 ENCOUNTER — Inpatient Hospital Stay: Payer: Medicaid Other | Admitting: Internal Medicine

## 2023-11-04 ENCOUNTER — Encounter (HOSPITAL_COMMUNITY): Payer: Self-pay

## 2023-11-04 ENCOUNTER — Other Ambulatory Visit: Payer: Self-pay

## 2023-11-04 ENCOUNTER — Emergency Department (HOSPITAL_COMMUNITY)
Admission: EM | Admit: 2023-11-04 | Discharge: 2023-11-04 | Disposition: A | Discharge status: Home / Self Care | Payer: Medicaid Other | Attending: Emergency Medicine | Admitting: Emergency Medicine

## 2023-11-04 DIAGNOSIS — R569 Unspecified convulsions: Secondary | ICD-10-CM | POA: Diagnosis present

## 2023-11-04 LAB — BASIC METABOLIC PANEL
Anion gap: 8 (ref 5–15)
BUN: 7 mg/dL (ref 6–20)
CO2: 23 mmol/L (ref 22–32)
Calcium: 8.4 mg/dL — ABNORMAL LOW (ref 8.9–10.3)
Chloride: 106 mmol/L (ref 98–111)
Creatinine, Ser: 0.83 mg/dL (ref 0.44–1.00)
GFR, Estimated: >60 mL/min (ref >60)
Glucose, Bld: 103 mg/dL — ABNORMAL HIGH (ref 70–99)
Potassium: 4.3 mmol/L (ref 3.5–5.1)
Sodium: 137 mmol/L (ref 135–145)

## 2023-11-04 LAB — CBC
HCT: 36.4 % (ref 36.0–46.0)
Hemoglobin: 12.2 g/dL (ref 12.0–15.0)
MCH: 29 pg (ref 26.0–34.0)
MCHC: 33.5 g/dL (ref 30.0–36.0)
MCV: 86.7 fL (ref 80.0–100.0)
Platelets: 411 10*3/uL — ABNORMAL HIGH (ref 150–400)
RBC: 4.2 MIL/uL (ref 3.87–5.11)
RDW: 14.1 % (ref 11.5–15.5)
WBC: 9.9 10*3/uL (ref 4.0–10.5)
nRBC: 0 % (ref 0.0–0.2)

## 2023-11-04 LAB — URINALYSIS, ROUTINE W REFLEX MICROSCOPIC
Bacteria, UA: NONE SEEN
Bilirubin Urine: NEGATIVE
Glucose, UA: NEGATIVE mg/dL
Ketones, ur: NEGATIVE mg/dL
Leukocytes,Ua: NEGATIVE
Nitrite: NEGATIVE
Protein, ur: NEGATIVE mg/dL
Specific Gravity, Urine: 1.018 (ref 1.005–1.030)
pH: 7 (ref 5.0–8.0)

## 2023-11-04 LAB — PREGNANCY, URINE: Preg Test, Ur: NEGATIVE

## 2023-11-04 MED ORDER — ONDANSETRON HCL 4 MG PO TABS: 4.0000 mg | ORAL_TABLET | Freq: Four times a day (QID) | ORAL | 0 refills | Status: DC | PRN

## 2023-11-04 MED ORDER — LEVETIRACETAM IN NACL 1000 MG/100ML IV SOLN
1000.0000 mg | Freq: Once | INTRAVENOUS | Status: AC
  Administered 2023-11-04: 1000 mg via INTRAVENOUS
  Filled 2023-11-04: qty 100

## 2023-11-04 MED ORDER — LEVETIRACETAM 500 MG PO TABS: 500.0000 mg | ORAL_TABLET | Freq: Two times a day (BID) | ORAL | 1 refills | Status: DC

## 2023-11-04 MED ORDER — ACETAMINOPHEN 325 MG PO TABS
650.0000 mg | ORAL_TABLET | Freq: Once | ORAL | Status: AC
  Administered 2023-11-04: 650 mg via ORAL
  Filled 2023-11-04: qty 2

## 2023-11-04 NOTE — Discharge Instructions (Signed)
It was a pleasure taking part in your care.  As we discussed, the most likely cause of your seizure today was due to medication noncompliance.  Please begin taking Keppra twice a day 500 mg.  Take it once in the morning once at night.  Follow-up with neurology for outpatient management of your seizures.  Please return to the ED with any new or worsening symptoms.  Read the attached guide concerning seizures in adults.

## 2023-11-04 NOTE — ED Provider Notes (Signed)
[place] EMERGENCY DEPARTMENT AT [facility] Provider Note   CSN: [id number] Arrival date & time: 11/04/23  0725     History  Chief Complaint  Patient presents with   Seizures    Tracie Valenzuela is a 37 y.o. female with medical history of seizures.  The patient presents to the ED for evaluation of seizure-like activity.  Fianc at bedside.  The patient reports that this morning she had 2 seizures that lasted about 1 minute each.  Reports that this was witnessed by her fianc who is currently the bedside.  States that she has been off of her Keppra medication for the last 5 months.  Reports that the medication just did not make her feel well and this is the reason she stopped taking it.  It appears that she also had a similar incident back in August.  She reports that she does not see neurology here in [place].  Denies any tongue biting, urinary incontinence.  States she feels back to her baseline present.  Denies any one-sided weakness or numbness, fevers.  States that they did drink a large amount of alcohol last night prior to bed.  Denies any drugs.   Seizures      Home Medications Prior to Admission medications   Medication Sig Start Date End Date Taking? Authorizing Provider  levETIRAcetam (KEPPRA) 500 MG tablet Take 1 tablet (500 mg total) by mouth 2 (two) times daily. 11/04/23  Yes Al Decant, PA-C  ondansetron (ZOFRAN) 4 MG tablet Take 1 tablet (4 mg total) by mouth every 6 (six) hours as needed for nausea or vomiting. 11/04/23  Yes Al Decant, PA-C  Chlorphen-Phenyleph-Ibuprofen (ADVIL MULTI-SYMPTOM COLD & FLU PO) Take 1-2 tablets by mouth as needed (cold symptoms).    [provider]  Midazolam (NAYZILAM) 5 MG/0.1ML SOLN Place 5 mg into the nose as needed (For seizure lasting more than 2 minutes). 02/01/23   Windell Norfolk, MD  predniSONE (STERAPRED UNI-PAK 21 TAB) 10 MG (21) TBPK tablet Take by mouth daily. Take 6 tabs by mouth  daily  for 2 days, then 5 tabs for 2 days, then 4 tabs for 2 days, then 3 tabs for 2 days, 2 tabs for 2 days, then 1 tab by mouth daily for 2 days 07/10/23   Marita Kansas, PA-C      Allergies    Patient has no known allergies.    Review of Systems   Review of Systems  Constitutional:  Negative for fever.  Neurological:  Positive for seizures.    Physical Exam Updated Vital Signs BP 124/81 (BP Location: Right Arm)   Pulse 73   Temp 97.6 F (36.4 C) (Oral)   Resp 17   Ht 5\' 10"  (1.778 m)   Wt 92.1 kg   LMP 11/02/2023 (Approximate)   SpO2 100%   BMI 29.13 kg/m  Physical Exam Vitals and nursing note reviewed.  Constitutional:      General: She is not in acute distress.    Appearance: She is well-developed.  HENT:     Head: Normocephalic and atraumatic.  Eyes:     Conjunctiva/sclera: Conjunctivae normal.  Cardiovascular:     Rate and Rhythm: Normal rate and regular rhythm.     Heart sounds: No murmur heard. Pulmonary:     Effort: Pulmonary effort is normal. No respiratory distress.     Breath sounds: Normal breath sounds.  Abdominal:     Palpations: Abdomen is soft.     Tenderness:  There is no abdominal tenderness.  Musculoskeletal:        General: No swelling.     Cervical back: Neck supple.  Skin:    General: Skin is warm and dry.     Capillary Refill: Capillary refill takes less than 2 seconds.  Neurological:     Mental Status: She is alert and oriented to person, place, and time.     GCS: GCS eye subscore is 4. GCS verbal subscore is 5. GCS motor subscore is 6.     Cranial Nerves: Cranial nerves 2-12 are intact. No cranial nerve deficit.     Sensory: Sensation is intact.     Motor: Motor function is intact.     Coordination: Finger-Nose-Finger Test normal.     Comments: Reassuring neurological examination.  Fluent speech appreciated.  CN II through XII intact.  No pronator drift, intact finger-nose.  Psychiatric:        Mood and Affect: Mood normal.     ED  Results / Procedures / Treatments   Labs (all labs ordered are listed, but only abnormal results are displayed) Labs Reviewed  CBC - Abnormal; Notable for the following components:      Result Value   Platelets 411 (*)    All other components within normal limits  BASIC METABOLIC PANEL - Abnormal; Notable for the following components:   Glucose, Bld 103 (*)    Calcium 8.4 (*)    All other components within normal limits  URINALYSIS, ROUTINE W REFLEX MICROSCOPIC  PREGNANCY, URINE  LEVETIRACETAM LEVEL    EKG None  Radiology No results found.  Procedures Procedures   Medications Ordered in ED Medications  levETIRAcetam (KEPPRA) IVPB 1000 mg/100 mL premix (0 mg Intravenous Stopped 11/04/23 0858)  acetaminophen (TYLENOL) tablet 650 mg (650 mg Oral Given 11/04/23 9678)    ED Course/ Medical Decision Making/ A&P  Medical Decision Making Amount and/or Complexity of Data Reviewed Labs: ordered.  Risk Prescription drug management.   37 year old female presents to ED for evaluation of seizure-like activity.  Please see HPI for further details.  On my examination the patient is conversing fluently.  She is moving all extremity spontaneously and according to fashion.  She has no pronator drift, no slurred speech, no facial droop.  She has intact finger-nose, heel-to-shin.  CN II through XII are intact.  She appears to be back at her baseline, alert and oriented x 4.  Afebrile and nontachycardic.  Lung sounds are clear bilaterally, she is not hypoxic.  No abdominal tenderness noted.  Patient reports history of seizures, states that she is not compliant on seizure-like medication which is most likely cause of her seizure today.  Have loaded her with 1 g of Keppra, will restart her on home Keppra medication 500 mg twice daily.  Will refer her to neurology for further workup and management.  She was advised that she needs to begin taking her Keppra as prescribed.  She will follow-up with  neurology.  I advised her to return to the ED with any new or worsening symptoms.  Stable to discharge home.   Final Clinical Impression(s) / ED Diagnoses Final diagnoses:  Seizure (HCC)    Rx / DC Orders ED Discharge Orders          Ordered    levETIRAcetam (KEPPRA) 500 MG tablet  2 times daily        11/04/23 0951    ondansetron (ZOFRAN) 4 MG tablet  Every 6 hours PRN  11/04/23 0951              Al Decant, PA-C 11/04/23 6578    Laurence Spates, MD 11/05/23 1059

## 2023-11-04 NOTE — ED Notes (Signed)
POC PREG NEG 

## 2023-11-04 NOTE — ED Triage Notes (Signed)
Pt arrived via ems to the er for the c/o seizure today around 5am and 0630am. Pt has a hx of seizures and been off her seizure meds for around 5 months. VSS PER EMS. Pt walked to bed from ems stretcher, alert and oriented.

## 2023-11-07 LAB — LEVETIRACETAM LEVEL: Levetiracetam Lvl: <2 ug/mL — ABNORMAL LOW (ref 10.0–40.0)

## 2023-11-10 ENCOUNTER — Telehealth: Payer: Self-pay | Admitting: Neurology

## 2023-11-10 NOTE — Telephone Encounter (Signed)
 I spoke with Dr. Onita verbally about this patient. She has reviewed the patient chart. She is in agreement with the medications prescribed at the hospital.  Dr. Onita recommends speaking with Dr. Gregg next week regarding working in the patient for a sooner appointment.

## 2023-11-10 NOTE — Telephone Encounter (Signed)
 Pt stating, had 2 seizures on 11/04/23. In middle of the night bit partner's hand, tongue was rolling in the back of mouth. Partner tried to pull on my tongue. Called EMS, but did not go with them to the hospital. Tracie Valenzuela back to sleep and had another seizure, this seizure was shorter, called EMS, was taken to the hospital. Prescribed  levETIRAcetam  (KEPPRA ) 500 MG tablet but this medication made me nauseous, then prescribed ondansetron  (ZOFRAN ) 4 MG tablet.  Had discontinued taking the Levetiracetam  in summer of 2024 because of the side effects; loss of appetite, hair loss.  Would like a call back for a earlier appt than 05/23/24

## 2023-11-15 ENCOUNTER — Other Ambulatory Visit: Payer: Self-pay | Admitting: Neurology

## 2023-11-15 MED ORDER — BRIVIACT 50 MG PO TABS: 50.0000 mg | ORAL_TABLET | Freq: Two times a day (BID) | ORAL | 5 refills | Status: AC

## 2023-11-15 NOTE — Telephone Encounter (Signed)
 Since patient was not able to tolerate Levetiracetam, will try her on Briviact if approved

## 2023-11-15 NOTE — Progress Notes (Signed)
 Patient could not tolerate Keppra, will try her on Briviact.

## 2023-11-28 ENCOUNTER — Telehealth: Payer: Self-pay

## 2023-11-28 NOTE — Telephone Encounter (Signed)
Call to patient, no answer. Left voicemail to return call.

## 2024-01-16 ENCOUNTER — Encounter: Payer: Self-pay | Admitting: Neurology

## 2024-01-16 ENCOUNTER — Ambulatory Visit: Payer: Medicaid Other | Admitting: Neurology

## 2024-01-16 VITALS — BP 123/79 | HR 80 | Ht 70.0 in | Wt <= 1120 oz

## 2024-01-16 DIAGNOSIS — G40309 Generalized idiopathic epilepsy and epileptic syndromes, not intractable, without status epilepticus: Secondary | ICD-10-CM | POA: Diagnosis not present

## 2024-01-16 MED ORDER — LEVETIRACETAM ER 1000 MG PO TB24: 1000.0000 mg | ORAL_TABLET | Freq: Every day | ORAL | 3 refills | Status: DC

## 2024-01-16 NOTE — Progress Notes (Signed)
 [facility]  PATIENT: Tracie Valenzuela DOB: [birth year]  REQUESTING CLINICIAN: [facility] * HISTORY FROM: Patient and boyfriend  REASON FOR VISIT: New onset epilepsy    HISTORICAL  CHIEF COMPLAINT:  Chief Complaint  Patient presents with   Seizures    Rm17, alone, Sz: last 1 was 11/03/24, no problems since started keppra, self decreased keppr adose due to nausea, dizziness, blurry vision, floaters, and ha's. Side effects have decreased since she decreased dose for the past month.   INTERVAL HISTORY 02/05/2024:  Patient presents today for follow-up, last visit was in March 24.  At that time we requested routine EEG but it was not done.  Patient was continued on levetiracetam 1000 mg twice daily but she has been self decreasing the medication due to nausea, dizziness, blurred vision and actually stopped taking the medication until December 27 when she had a breakthrough seizure, reports having 2 seizures that day.  She was taken to the hospital, Keppra 1000 mg restarted.  Patient tells me that she restarted 1000 mg twice daily but is causing a side effect including tiredness, feeling worse on Keppra, therefore she reduce it to 500 mg twice daily.   HISTORY OF PRESENT ILLNESS:  This is a 38 year old woman past medical history of substance use including alcohol and marijuana who is presenting to establish care for seizure disorder.  Patient reports her first seizure happened in June of last year.  It was a nighttime seizure.  She woke up confused to paramedics on her.  At that time she did not go to the hospital per patient report but there is an ED note on 04/30/22.  From June until now in March she had a total of 4 seizures, and they are all nocturnal.  Her third seizure was in February, at that time she presented to the ED and was started on Keppra 500 mg twice daily.  She was not taking the Keppra until the last seizure 3 nights ago when she restarted the Keppra  500 mg twice daily.  While on the Keppra she denies any side effect from the medication.  Of note patient also reported she has been drinking alcohol nightly. She reports since her last seizure 3 nights ago she discontinued alcohol and has been compliant with her Keppra.  Denies any withdrawal symptoms such as tremor, irritability or agitation.  She denies any family history of seizures, denies denies any seizure risk factors.   Handedness: Left handed   Onset: June of last year   Seizure Type: Nocturnal, generalized seizure   Current frequency: A total of 4, last one being 3 days ago   Any injuries from seizures: tongue bite, falling off bed    Seizure risk factors: None reported   Previous ASMs: None   Currenty ASMs: Levetiracetam 500 mg twice daily   ASMs side effects: Denies   Brain Images: Normal head CT   Previous EEGs: Not done previously    OTHER MEDICAL CONDITIONS: Alcohol use disorder   REVIEW OF SYSTEMS: Full 14 system review of systems performed and negative with exception of: As noted in the HPI   ALLERGIES: Allergies  Allergen Reactions   Keppra [Levetiracetam]     has    HOME MEDICATIONS: Outpatient Medications Prior to Visit  Medication Sig Dispense Refill   Midazolam (NAYZILAM) 5 MG/0.1ML SOLN Place 5 mg into the nose as needed (For seizure lasting more than 2 minutes). 2 each 2   levETIRAcetam (KEPPRA) 500 MG tablet  Take 1 tablet (500 mg total) by mouth 2 (two) times daily. 60 tablet 1   Chlorphen-Phenyleph-Ibuprofen (ADVIL MULTI-SYMPTOM COLD & FLU PO) Take 1-2 tablets by mouth as needed (cold symptoms).     ondansetron (ZOFRAN) 4 MG tablet Take 1 tablet (4 mg total) by mouth every 6 (six) hours as needed for nausea or vomiting. 12 tablet 0   predniSONE (STERAPRED UNI-PAK 21 TAB) 10 MG (21) TBPK tablet Take by mouth daily. Take 6 tabs by mouth daily  for 2 days, then 5 tabs for 2 days, then 4 tabs for 2 days, then 3 tabs for 2 days, 2 tabs for 2 days,  then 1 tab by mouth daily for 2 days 42 tablet 0   No facility-administered medications prior to visit.    PAST MEDICAL HISTORY: Past Medical History:  Diagnosis Date   Medical history non-contributory    Seizures (HCC)     PAST SURGICAL HISTORY: Past Surgical History:  Procedure Laterality Date   ACHILLES TENDON REPAIR     ACHILLES TENDON REPAIR Bilateral 2016   MENISCUS REPAIR     MENISCUS REPAIR  2006    FAMILY HISTORY: Family History  Problem Relation Age of Onset   Hypertension Father    Diabetes Father    Hypertension Maternal Aunt    Diabetes Maternal Aunt    Hypertension Maternal Uncle    Diabetes Maternal Uncle    Hypertension Paternal Aunt    Diabetes Paternal Aunt    Hypertension Paternal Uncle    Diabetes Paternal Uncle    Hypertension Maternal Grandmother    Diabetes Maternal Grandmother    Hypertension Paternal Grandmother    Diabetes Paternal Grandmother     SOCIAL HISTORY: Social History   Socioeconomic History   Marital status: Single    Spouse name: Not on file   Number of children: Not on file   Years of education: Not on file   Highest education level: Not on file  Occupational History   Not on file  Tobacco Use   Smoking status: Never   Smokeless tobacco: Never  Vaping Use   Vaping status: Never Used  Substance and Sexual Activity   Alcohol use: Yes    Alcohol/week: 14.0 standard drinks of alcohol    Types: 14 Cans of beer per week   Drug use: Yes    Comment: cannabis, cocaine hx   Sexual activity: Yes  Other Topics Concern   Not on file  Social History Narrative   Not on file   Social Drivers of Health   Financial Resource Strain: Not on file  Food Insecurity: Not on file  Transportation Needs: Not on file  Physical Activity: Not on file  Stress: Not on file  Social Connections: Not on file  Intimate Partner Violence: Not on file    PHYSICAL EXAM  GENERAL EXAM/CONSTITUTIONAL: Vitals:  Vitals:   01/16/24 1109   BP: 123/79  Pulse: 80  Weight: 20 lb 8.6 oz (9.317 kg)  Height: 5\' 10"  (1.778 m)   Body mass index is 2.95 kg/m. Wt Readings from Last 3 Encounters:  01/16/24 20 lb 8.6 oz (9.317 kg)  11/04/23 203 lb (92.1 kg)  07/14/23 197 lb (89.4 kg)   Patient is in no distress; well developed, nourished and groomed; neck is supple  MUSCULOSKELETAL: Gait, strength, tone, movements noted in Neurologic exam below  NEUROLOGIC: MENTAL STATUS:      No data to display         awake,  alert, oriented to person, place and time recent and remote memory intact normal attention and concentration language fluent, comprehension intact, naming intact fund of knowledge appropriate  CRANIAL NERVE:  2nd, 3rd, 4th, 6th - Visual fields full to confrontation, extraocular muscles intact, no nystagmus 5th - facial sensation symmetric 7th - facial strength symmetric 8th - hearing intact 9th - palate elevates symmetrically, uvula midline 11th - shoulder shrug symmetric 12th - tongue protrusion midline  MOTOR:  normal bulk and tone, full strength in the BUE, BLE  SENSORY:  normal and symmetric to light touch  COORDINATION:  finger-nose-finger, fine finger movements normal  GAIT/STATION:  normal   DIAGNOSTIC DATA (LABS, IMAGING, TESTING) - I reviewed patient records, labs, notes, testing and imaging myself where available.  Lab Results  Component Value Date   WBC 9.9 11/04/2023   HGB 12.2 11/04/2023   HCT 36.4 11/04/2023   MCV 86.7 11/04/2023   PLT 411 (H) 11/04/2023      Component Value Date/Time   NA 137 11/04/2023 0741   K 4.3 11/04/2023 0741   CL 106 11/04/2023 0741   CO2 23 11/04/2023 0741   GLUCOSE 103 (H) 11/04/2023 0741   BUN 7 11/04/2023 0741   CREATININE 0.83 11/04/2023 0741   CALCIUM 8.4 (L) 11/04/2023 0741   PROT 6.0 (L) 12/29/2022 2200   ALBUMIN 3.2 (L) 12/29/2022 2200   AST 18 12/29/2022 2200   ALT 11 12/29/2022 2200   ALKPHOS 47 12/29/2022 2200   BILITOT 0.3  12/29/2022 2200   GFRNONAA >60 11/04/2023 0741   GFRAA >60 10/05/2018 0759   No results found for: "CHOL", "HDL", "LDLCALC", "LDLDIRECT", "TRIG" Lab Results  Component Value Date   HGBA1C 5.4 11/23/2017   No results found for: "VITAMINB12" Lab Results  Component Value Date   TSH 1.436 07/23/2022    Head CT 04/30/22 No acute intracranial process identified    ASSESSMENT AND PLAN  38 y.o. year old female  with history of substance abuse including alcohol and marijuana, epilepsy who is presenting for follow-up.  She does have a history of noncompliance resulting in breakthrough seizure.  Plan will be for patient to continue with Keppra 500 mg twice daily, I will get her Keppra extended release to minimize side effect.  If not approved, will consider Lamotrigine. We will obtain routine EEG and MRI brain.  I will contact her to go over the results otherwise I will see her in 6 months for follow-up.  Advised patient to contact me if she does have another seizure. She voiced understanding.    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  Other recommendations include using caution when using heavy equipment or power tools. Avoid working on ladders or at heights. Take showers instead of baths.  Do not swim alone.  Ensure the water temperature is not too high on the home water heater. Do not go swimming alone. Do not lock yourself in a room alone (i.e. bathroom). When caring for infants or small children, sit down when  holding, feeding, or  changing them to minimize risk of injury to the child in the event you have a seizure. Maintain good sleep hygiene. Avoid alcohol.  Also recommend adequate sleep, hydration, good diet and minimize stress.   During the Seizure  - First, ensure adequate ventilation and place patients on the floor on their left side  Loosen clothing around the neck and ensure the airway is patent. If the patient is clenching the teeth, do not force the mouth open with any object as this can cause severe damage - Remove all items from the surrounding that can be hazardous. The patient may be oblivious to what's happening and may not even know what he or she is doing. If the patient is confused and wandering, either gently guide him/her away and block access to outside areas - Reassure the individual and be comforting - Call 911. In most cases, the seizure ends before EMS arrives. However, there are cases when seizures may last over 3 to 5 minutes. Or the individual may have developed breathing difficulties or severe injuries. If a pregnant patient or a person with diabetes develops a seizure, it is prudent to call an ambulance. - Finally, if the patient does not regain full consciousness, then call EMS. Most patients will remain confused for about 45 to 90 minutes after a seizure, so you must use judgment in calling for help. - Avoid restraints but make sure the patient is in a bed with padded side rails - Place the individual in a lateral position with the neck slightly flexed; this will help the saliva drain from the mouth and prevent the tongue from falling backward - Remove all nearby furniture and other hazards from the area - Provide verbal assurance as the individual is regaining consciousness - Provide the patient with privacy if possible - Call for help and start treatment as ordered by the caregiver   After the Seizure (Postictal Stage)  After a seizure, most patients experience confusion, fatigue, muscle  pain and/or a headache. Thus, one should permit the individual to sleep. For the next few days, reassurance is essential. Being calm and helping reorient the person is also of importance.  Most seizures are painless and end spontaneously. Seizures are not harmful to others but can lead to complications such as stress on the lungs, brain and the heart. Individuals with prior lung problems may develop labored breathing and respiratory distress.     Orders Placed This Encounter  Procedures   MR BRAIN W WO CONTRAST    Meds ordered this encounter  Medications   levETIRAcetam ER 1000 MG TB24    Sig: Take 1,000 mg by mouth daily.    Dispense:  90 tablet    Refill:  3    Return in about 6 months (around 07/18/2024).    Windell Norfolk, MD 01/16/2024, 1:13 PM  [facility] [address], [address] [place], [place] [address] [phone]

## 2024-01-16 NOTE — Patient Instructions (Signed)
 Continue with Keppra 500 mg twice daily for now  We will try to switch to Keppra XR 1000 mg daily, if not approved, will consider Lamotrigine  Routine EEG  MRI Brain with and without contrast  Return in 6 months or sooner if worse

## 2024-01-19 NOTE — Telephone Encounter (Cosign Needed)
 I have a signed and completed note in this patients chart.

## 2024-01-24 ENCOUNTER — Ambulatory Visit: Admitting: Neurology

## 2024-01-24 ENCOUNTER — Encounter: Payer: Self-pay | Admitting: Neurology

## 2024-01-24 DIAGNOSIS — G40309 Generalized idiopathic epilepsy and epileptic syndromes, not intractable, without status epilepticus: Secondary | ICD-10-CM

## 2024-01-24 NOTE — Procedures (Signed)
   History:  38 year old woman with epilepsy.   EEG classification:  Awake and asleep  Duration: 25 minutes   Technical aspects: This EEG study was done with scalp electrodes positioned according to the 10-20 International system of electrode placement. Electrical activity was reviewed with band pass filter of 1-70Hz , sensitivity of 7 uV/mm, display speed of 79mm/sec with a 60Hz  notched filter applied as appropriate. EEG data were recorded continuously and digitally stored.   Description of the recording: The background rhythms of this recording consists of a fairly well modulated medium amplitude background activity of 12 Hz. As the record progresses, the patient initially is in the waking state, but appears to enter the early stage II sleep during the recording, with rudimentary sleep spindles and vertex sharp wave activity seen. During the wakeful state, photic stimulation was performed, and no abnormal responses were seen. Hyperventilation was also performed, no abnormal response seen. No epileptiform discharges seen during this recording. There was no focal slowing.   Abnormality: None   Impression: This is a normal awake and sleep EEG. No evidence of interictal epileptiform discharges. Normal EEGs, however, do not rule out epilepsy.    Windell Norfolk, MD [facility]

## 2024-01-25 ENCOUNTER — Telehealth: Payer: Self-pay | Admitting: Neurology

## 2024-01-25 NOTE — Telephone Encounter (Signed)
 healthy blue Tracie Valenzuela: [id number] exp. 01/25/24-03/24/24 sent to GI [phone]

## 2024-02-01 MED ORDER — LEVETIRACETAM 500 MG PO TABS: 500.0000 mg | ORAL_TABLET | Freq: Two times a day (BID) | ORAL | 0 refills | Status: DC

## 2024-02-22 ENCOUNTER — Telehealth: Payer: Self-pay

## 2024-02-22 ENCOUNTER — Other Ambulatory Visit (HOSPITAL_COMMUNITY): Payer: Self-pay

## 2024-02-22 NOTE — Telephone Encounter (Signed)
 Pharmacy Patient Advocate Encounter  Received notification from [facility] that Prior Authorization for Elepsia XR 1000MG  er tablets has been APPROVED from 02/22/2024 to 02/21/2025   PA #/Case ID/Reference #: PA Case ID #: [id number]

## 2024-03-06 ENCOUNTER — Ambulatory Visit
Admission: RE | Admit: 2024-03-06 | Discharge: 2024-03-06 | Disposition: A | Discharge status: Home / Self Care | Source: Ambulatory Visit | Attending: Neurology

## 2024-03-06 ENCOUNTER — Encounter: Payer: Self-pay | Admitting: Neurology

## 2024-03-06 DIAGNOSIS — G40309 Generalized idiopathic epilepsy and epileptic syndromes, not intractable, without status epilepticus: Secondary | ICD-10-CM | POA: Diagnosis not present

## 2024-03-06 MED ORDER — GADOPICLENOL 0.5 MMOL/ML IV SOLN
10.0000 mL | Freq: Once | INTRAVENOUS | Status: AC | PRN
  Administered 2024-03-06: 10 mL via INTRAVENOUS

## 2024-04-01 ENCOUNTER — Other Ambulatory Visit: Payer: Self-pay | Admitting: Neurology

## 2024-04-03 ENCOUNTER — Other Ambulatory Visit: Payer: Self-pay

## 2024-04-03 ENCOUNTER — Encounter (HOSPITAL_COMMUNITY): Payer: Self-pay

## 2024-04-03 ENCOUNTER — Emergency Department (HOSPITAL_COMMUNITY)
Admission: EM | Admit: 2024-04-03 | Discharge: 2024-04-03 | Disposition: A | Discharge status: Home / Self Care | Attending: Emergency Medicine | Admitting: Emergency Medicine

## 2024-04-03 DIAGNOSIS — Z91148 Patient's other noncompliance with medication regimen for other reason: Secondary | ICD-10-CM | POA: Insufficient documentation

## 2024-04-03 DIAGNOSIS — R569 Unspecified convulsions: Secondary | ICD-10-CM | POA: Diagnosis present

## 2024-04-03 LAB — CBG MONITORING, ED: Glucose-Capillary: 92 mg/dL (ref 70–99)

## 2024-04-03 LAB — HCG, SERUM, QUALITATIVE: Preg, Serum: NEGATIVE

## 2024-04-03 MED ORDER — LEVETIRACETAM 500 MG PO TABS
1000.0000 mg | ORAL_TABLET | Freq: Once | ORAL | Status: AC
Start: 2024-04-03 — End: 2024-04-03
  Administered 2024-04-03: 1000 mg via ORAL
  Filled 2024-04-03: qty 2

## 2024-04-03 MED ORDER — LEVETIRACETAM 500 MG PO TABS: 500.0000 mg | ORAL_TABLET | Freq: Two times a day (BID) | ORAL | 1 refills | Status: DC

## 2024-04-03 MED ORDER — ONDANSETRON HCL 4 MG/2ML IJ SOLN
4.0000 mg | Freq: Once | INTRAMUSCULAR | Status: AC
  Administered 2024-04-03: 4 mg via INTRAVENOUS
  Filled 2024-04-03: qty 2

## 2024-04-03 MED ORDER — SODIUM CHLORIDE 0.9 % IV BOLUS
1000.0000 mL | Freq: Once | INTRAVENOUS | Status: AC
  Administered 2024-04-03: 1000 mL via INTRAVENOUS

## 2024-04-03 NOTE — ED Triage Notes (Signed)
 Patient BIB [facility] from home due to seizures. Patient had witnessed seizure lasting 3-5 minutes according to patient family. Patient has been out of lamictal for 2-3 days. Family gave 5mg  versed intranasal. Family states postictal period lasting 10-15 minutes. Patient alert, but lethargic upon arrival. VSS with ems.

## 2024-04-03 NOTE — ED Provider Notes (Signed)
 [facility] Emergency Department Provider Note MRN:  [id number]  Arrival date & time: 04/03/24     Chief Complaint   Seizures   History of Present Illness   Tracie Valenzuela is a 38 y.o. year-old female with a history of seizure disorder presenting to the ED with chief complaint of seizure.  Seizure lasting 3 to 5 minutes at home, witnessed by significant other.  They were sleeping next to each other and she started having total body shaking, typical of her prior seizures.  Has not had a seizure in about 5 months.  Missed her dose of Keppra  today.  Denies recent illness, she does not remember what happened, she denies any pain or trauma, denies numbness or weakness to the arms or legs.  Just feels generally tired.  Review of Systems  A thorough review of systems was obtained and all systems are negative except as noted in the HPI and PMH.   Patient's Health History    Past Medical History:  Diagnosis Date   Medical history non-contributory    Seizures (HCC)     Past Surgical History:  Procedure Laterality Date   ACHILLES TENDON REPAIR     ACHILLES TENDON REPAIR Bilateral 2016   MENISCUS REPAIR     MENISCUS REPAIR  2006    Family History  Problem Relation Age of Onset   Hypertension Father    Diabetes Father    Hypertension Maternal Aunt    Diabetes Maternal Aunt    Hypertension Maternal Uncle    Diabetes Maternal Uncle    Hypertension Paternal Aunt    Diabetes Paternal Aunt    Hypertension Paternal Uncle    Diabetes Paternal Uncle    Hypertension Maternal Grandmother    Diabetes Maternal Grandmother    Hypertension Paternal Grandmother    Diabetes Paternal Grandmother     Social History   Socioeconomic History   Marital status: Single    Spouse name: Not on file   Number of children: Not on file   Years of education: Not on file   Highest education level: Not on file  Occupational History   Not on file  Tobacco Use   Smoking status:  Never   Smokeless tobacco: Never  Vaping Use   Vaping status: Never Used  Substance and Sexual Activity   Alcohol use: Yes    Alcohol/week: 14.0 standard drinks of alcohol    Types: 14 Cans of beer per week   Drug use: Yes    Comment: cannabis, cocaine hx   Sexual activity: Yes  Other Topics Concern   Not on file  Social History Narrative   Not on file   Social Drivers of Health   Financial Resource Strain: Not on file  Food Insecurity: Not on file  Transportation Needs: Not on file  Physical Activity: Not on file  Stress: Not on file  Social Connections: Not on file  Intimate Partner Violence: Not on file     Physical Exam   Vitals:   04/03/24 0130 04/03/24 0245  BP: (!) 131/111 112/89  Pulse: 87 73  Resp: (!) 22 15  Temp:    SpO2: 97% 100%    CONSTITUTIONAL: Well-appearing, NAD NEURO/PSYCH:  Alert and oriented x 3, normal and symmetric strength and sensation, normal coordination, normal speech, no meningismus EYES:  eyes equal and reactive ENT/NECK:  no LAD, no JVD CARDIO: Regular rate, well-perfused, normal S1 and S2 PULM:  CTAB no wheezing or rhonchi GI/GU:  non-distended, non-tender  MSK/SPINE:  No gross deformities, no edema SKIN:  no rash, atraumatic   *Additional and/or pertinent findings included in MDM below  Diagnostic and Interventional Summary    EKG Interpretation Date/Time:  Tuesday Apr 03 2024 00:49:31 EDT Ventricular Rate:  92 PR Interval:  164 QRS Duration:  91 QT Interval:  358 QTC Calculation: 443 R Axis:   56  Text Interpretation: Sinus rhythm Probable left atrial enlargement Probable anteroseptal infarct, old Confirmed by Gwenetta Lennert [phone]) on 04/03/2024 1:27:39 AM       Labs Reviewed  HCG, SERUM, QUALITATIVE  CBG MONITORING, ED    No orders to display    Medications  ondansetron  (ZOFRAN ) injection 4 mg (4 mg Intravenous Given 04/03/24 0122)  levETIRAcetam  (KEPPRA ) tablet 1,000 mg (1,000 mg Oral Given 04/03/24 0138)   sodium chloride  0.9 % bolus 1,000 mL (1,000 mLs Intravenous New Bag/Given 04/03/24 0145)     Procedures  /  Critical Care Procedures  ED Course and Medical Decision Making  Initial Impression and Ddx History would suggest a seizure related to medication noncompliance today, known seizure disorder, no concerning red flags to the presentation, reassuring vital signs, no fever, no meningismus, no focal neurological deficits.  Will monitor, provide her dose of Keppra , reassess.  EMS report was that she takes Lamictal but this does not appear to be true according to the patient and according to her prescription history according to our ED pharmacist.  For some reason Keppra  is listed as an allergy however patient denies this.  Past medical/surgical history that increases complexity of ED encounter: Seizure disorder  Interpretation of Diagnostics I personally reviewed the EKG and my interpretation is as follows: Sinus rhythm  hCG negative, blood sugar unremarkable  Patient Reassessment and Ultimate Disposition/Management     Patient observed for a few hours with no return of seizure activity, continues to feel normal, would like to go home.  Seems like a pretty straightforward case of seizure disorder with a missed dose of Keppra .  Further diagnostics seem to be very low yield at this time, appropriate for discharge with neurology follow-up.  Patient management required discussion with the following services or consulting groups:  None  Complexity of Problems Addressed Acute illness or injury that poses threat of life of bodily function  Additional Data Reviewed and Analyzed Further history obtained from: Further history from spouse/family member  Additional Factors Impacting ED Encounter Risk Consideration of hospitalization  Merrick Abe. Harless Lien, MD [facility] Emergency Medicine [facility] [email or web address]  Final Clinical Impressions(s) / ED Diagnoses      ICD-10-CM   1. Seizure (HCC)  R56.9       ED Discharge Orders          Ordered    levETIRAcetam  (KEPPRA ) 500 MG tablet  2 times daily        04/03/24 0241             Discharge Instructions Discussed with and Provided to Patient:     Discharge Instructions      You were evaluated in the Emergency Department and after careful evaluation, we did not find any emergent condition requiring admission or further testing in the hospital.  Your exam/testing today is overall reassuring.  Continue your Keppra  500 mg twice daily, follow-up closely with your neurologist to discuss your visit to the emergency department.  Please return to the Emergency Department if you experience any worsening of your condition.   Thank you for allowing us  to be  a part of your care.     Edson Graces, MD 04/03/24 [phone]

## 2024-04-03 NOTE — Discharge Instructions (Signed)
 You were evaluated in the Emergency Department and after careful evaluation, we did not find any emergent condition requiring admission or further testing in the hospital.  Your exam/testing today is overall reassuring.  Continue your Keppra  500 mg twice daily, follow-up closely with your neurologist to discuss your visit to the emergency department.  Please return to the Emergency Department if you experience any worsening of your condition.   Thank you for allowing us  to be a part of your care.

## 2024-04-03 NOTE — ED Notes (Signed)
 Patient ambulated to bathroom. Steady on feet, no issues or complaints.

## 2024-05-23 ENCOUNTER — Ambulatory Visit: Payer: Medicaid Other | Admitting: Neurology

## 2024-06-20 ENCOUNTER — Encounter: Payer: Self-pay | Admitting: Neurology

## 2024-06-20 ENCOUNTER — Telehealth: Payer: Self-pay

## 2024-06-20 MED ORDER — LEVETIRACETAM 500 MG PO TABS: 500.0000 mg | ORAL_TABLET | Freq: Two times a day (BID) | ORAL | 1 refills | Status: DC

## 2024-06-20 NOTE — Telephone Encounter (Signed)
 Last note stated: Continue with Keppra  500 mg twice daily for now  We will try to switch to Keppra  XR 1000 mg daily, if not approved, will consider Lamotrigine   Routing to provider to see how they wish to proceed?

## 2024-06-20 NOTE — Telephone Encounter (Signed)
Refill sent and mychart message sent.

## 2024-06-21 ENCOUNTER — Other Ambulatory Visit: Payer: Self-pay | Admitting: Neurology

## 2024-06-21 MED ORDER — LEVETIRACETAM 500 MG PO TABS: 500.0000 mg | ORAL_TABLET | Freq: Two times a day (BID) | ORAL | 1 refills | Status: DC

## 2024-06-21 NOTE — Addendum Note (Signed)
 Addended by: JOSHUA IZETTA CROME on: 06/21/2024 12:29 PM   Modules accepted: Orders

## 2024-07-23 ENCOUNTER — Ambulatory Visit: Admitting: Neurology

## 2024-07-23 ENCOUNTER — Encounter: Payer: Self-pay | Admitting: Neurology

## 2024-07-23 VITALS — BP 109/76 | HR 86 | Ht 70.0 in | Wt 198.0 lb

## 2024-07-23 DIAGNOSIS — G40309 Generalized idiopathic epilepsy and epileptic syndromes, not intractable, without status epilepticus: Secondary | ICD-10-CM

## 2024-07-23 MED ORDER — OXCARBAZEPINE 300 MG PO TABS: 300.0000 mg | ORAL_TABLET | Freq: Two times a day (BID) | ORAL | 3 refills | Status: AC

## 2024-07-23 MED ORDER — LEVETIRACETAM 500 MG PO TABS: 500.0000 mg | ORAL_TABLET | Freq: Two times a day (BID) | ORAL | 1 refills | Status: AC

## 2024-07-23 NOTE — Patient Instructions (Signed)
 Continue with levetiracetam  500 mg daily Start oxcarbazepine  300 mg twice daily Please contact me if you have another seizure Follow-up in 6 months or sooner if worse.

## 2024-07-23 NOTE — Progress Notes (Signed)
 [facility]  PATIENT: Tracie Valenzuela DOB: [birth year]  REQUESTING CLINICIAN: [facility] * HISTORY FROM: Patient REASON FOR VISIT: Epilepsy follow up.    HISTORICAL  CHIEF COMPLAINT:  Chief Complaint  Patient presents with   Seizures    Rm13, alone, sz followup: last sz in December, doing well, pt did mention ocassional ha's. Pt stated they occur 8/30 days.     INTERVAL HISTORY 07/23/2024 Patient presents today for follow-up, last visit was in March.  At that time, we tried to switch him to levetiracetam  extended release due to side effect from the regular levetiracetam .  Medication was approved but patient did have difficulty getting the medication so she continued to be on levetiracetam  immediate release.  Due to side effect she has self decreased decrease to once a day, unfortunately did have a breakthrough seizure on May 27.  She tells me at that time she did not take her medication.  Denies any additional seizures.  Her workup for showed a normal brain MRI and a normal routine EEG.   INTERVAL HISTORY 02/05/2024:  Patient presents today for follow-up, last visit was in March 24.  At that time we requested routine EEG but it was not done.  Patient was continued on levetiracetam  1000 mg twice daily but she has been self decreasing the medication due to nausea, dizziness, blurred vision and actually stopped taking the medication until December 27 when she had a breakthrough seizure, reports having 2 seizures that day.  She was taken to the hospital, Keppra  1000 mg restarted.  Patient tells me that she restarted 1000 mg twice daily but is causing a side effect including tiredness, feeling worse on Keppra , therefore she reduce it to 500 mg twice daily.   HISTORY OF PRESENT ILLNESS:  This is a 38 year old woman past medical history of substance use including alcohol and marijuana who is presenting to establish care for seizure disorder.  Patient reports her  first seizure happened in June of last year.  It was a nighttime seizure.  She woke up confused to paramedics on her.  At that time she did not go to the hospital per patient report but there is an ED note on 04/30/22.  From June until now in March she had a total of 4 seizures, and they are all nocturnal.  Her third seizure was in February, at that time she presented to the ED and was started on Keppra  500 mg twice daily.  She was not taking the Keppra  until the last seizure 3 nights ago when she restarted the Keppra  500 mg twice daily.  While on the Keppra  she denies any side effect from the medication.  Of note patient also reported she has been drinking alcohol nightly. She reports since her last seizure 3 nights ago she discontinued alcohol and has been compliant with her Keppra .  Denies any withdrawal symptoms such as tremor, irritability or agitation.  She denies any family history of seizures, denies denies any seizure risk factors.   Handedness: Left handed   Onset: June of last year   Seizure Type: Nocturnal, generalized seizure   Current frequency: Last seizure Apr 03 2024  Any injuries from seizures: tongue bite, falling off bed    Seizure risk factors: None reported   Previous ASMs: Levetiracetam    Currenty ASMs: Levetiracetam  500 mg daily   ASMs side effects: Denies   Brain Images: Normal head CT   Previous EEGs: Normal   OTHER MEDICAL CONDITIONS: Alcohol use disorder  REVIEW OF SYSTEMS: Full 14 system review of systems performed and negative with exception of: As noted in the HPI   ALLERGIES: Allergies  Allergen Reactions   Keppra  [Levetiracetam ]     has    HOME MEDICATIONS: Outpatient Medications Prior to Visit  Medication Sig Dispense Refill   Midazolam  (NAYZILAM ) 5 MG/0.1ML SOLN Place 5 mg into the nose as needed (For seizure lasting more than 2 minutes). 2 each 2   levETIRAcetam  (KEPPRA ) 500 MG tablet Take 1 tablet (500 mg total) by mouth 2 (two) times  daily. 60 tablet 1   No facility-administered medications prior to visit.    PAST MEDICAL HISTORY: Past Medical History:  Diagnosis Date   Medical history non-contributory    Seizures (HCC)     PAST SURGICAL HISTORY: Past Surgical History:  Procedure Laterality Date   ACHILLES TENDON REPAIR     ACHILLES TENDON REPAIR Bilateral 2016   MENISCUS REPAIR     MENISCUS REPAIR  2006    FAMILY HISTORY: Family History  Problem Relation Age of Onset   Hypertension Father    Diabetes Father    Hypertension Maternal Aunt    Diabetes Maternal Aunt    Hypertension Maternal Uncle    Diabetes Maternal Uncle    Hypertension Paternal Aunt    Diabetes Paternal Aunt    Hypertension Paternal Uncle    Diabetes Paternal Uncle    Hypertension Maternal Grandmother    Diabetes Maternal Grandmother    Hypertension Paternal Grandmother    Diabetes Paternal Grandmother     SOCIAL HISTORY: Social History   Socioeconomic History   Marital status: Single    Spouse name: Not on file   Number of children: Not on file   Years of education: Not on file   Highest education level: Not on file  Occupational History   Not on file  Tobacco Use   Smoking status: Never   Smokeless tobacco: Never  Vaping Use   Vaping status: Never Used  Substance and Sexual Activity   Alcohol use: Yes    Alcohol/week: 14.0 standard drinks of alcohol    Types: 14 Cans of beer per week   Drug use: Yes    Comment: cannabis, cocaine hx   Sexual activity: Yes  Other Topics Concern   Not on file  Social History Narrative   Not on file   Social Drivers of Health   Financial Resource Strain: Not on file  Food Insecurity: Not on file  Transportation Needs: Not on file  Physical Activity: Not on file  Stress: Not on file  Social Connections: Not on file  Intimate Partner Violence: Not on file    PHYSICAL EXAM  GENERAL EXAM/CONSTITUTIONAL: Vitals:  Vitals:   07/23/24 1100  BP: 109/76  Pulse: 86   Weight: 198 lb (89.8 kg)  Height: 5' 10 (1.778 m)   Body mass index is 28.41 kg/m. Wt Readings from Last 3 Encounters:  07/23/24 198 lb (89.8 kg)  01/16/24 20 lb 8.6 oz (9.317 kg)  11/04/23 203 lb (92.1 kg)   Patient is in no distress; well developed, nourished and groomed; neck is supple  MUSCULOSKELETAL: Gait, strength, tone, movements noted in Neurologic exam below  NEUROLOGIC: MENTAL STATUS:      No data to display         awake, alert, oriented to person, place and time recent and remote memory intact normal attention and concentration language fluent, comprehension intact, naming intact fund of knowledge appropriate  CRANIAL  NERVE:  2nd, 3rd, 4th, 6th - Visual fields full to confrontation, extraocular muscles intact, no nystagmus 5th - facial sensation symmetric 7th - facial strength symmetric 8th - hearing intact 9th - palate elevates symmetrically, uvula midline 11th - shoulder shrug symmetric 12th - tongue protrusion midline  MOTOR:  normal bulk and tone, full strength in the BUE, BLE  SENSORY:  normal and symmetric to light touch  COORDINATION:  finger-nose-finger, fine finger movements normal  GAIT/STATION:  normal   DIAGNOSTIC DATA (LABS, IMAGING, TESTING) - I reviewed patient records, labs, notes, testing and imaging myself where available.  Lab Results  Component Value Date   WBC 9.9 11/04/2023   HGB 12.2 11/04/2023   HCT 36.4 11/04/2023   MCV 86.7 11/04/2023   PLT 411 (H) 11/04/2023      Component Value Date/Time   NA 137 11/04/2023 0741   K 4.3 11/04/2023 0741   CL 106 11/04/2023 0741   CO2 23 11/04/2023 0741   GLUCOSE 103 (H) 11/04/2023 0741   BUN 7 11/04/2023 0741   CREATININE 0.83 11/04/2023 0741   CALCIUM 8.4 (L) 11/04/2023 0741   PROT 6.0 (L) 12/29/2022 2200   ALBUMIN 3.2 (L) 12/29/2022 2200   AST 18 12/29/2022 2200   ALT 11 12/29/2022 2200   ALKPHOS 47 12/29/2022 2200   BILITOT 0.3 12/29/2022 2200   GFRNONAA >60  11/04/2023 0741   GFRAA >60 10/05/2018 0759   No results found for: CHOL, HDL, LDLCALC, LDLDIRECT, TRIG Lab Results  Component Value Date   HGBA1C 5.4 11/23/2017   No results found for: VITAMINB12 Lab Results  Component Value Date   TSH 1.436 07/23/2022    Head CT 04/30/22 No acute intracranial process identified   Routine EEG 01/24/2024 Normal   MRI Brain w wo contrast 03/06/2024 MRI scan of the brain with and without contrast showing only solitary tiny nonspecific T2/FLAIR white matter hyperintensity in the right parietal periventricular white matter with the differential discussed above.    ASSESSMENT AND PLAN  38 y.o. year old female  with history of substance abuse including alcohol and marijuana, epilepsy who is presenting for follow-up.  She does have a history of noncompliance resulting in breakthrough seizure.  Last seizure was on Apr 03 2024.  She is currently on levetiracetam  500 mg daily, will add oxcarbazepine  300 mg twice daily.  If she continues to struggle with medication noncompliance, will add a medication with a longer half-life such as clobazam or Fycompa.  I will see her in 6 months for follow-up and advised her to contact me sooner if she does have a breakthrough seizure.   1. Generalized idiopathic epilepsy and epileptic syndromes, not intractable, without status epilepticus (HCC)      Patient Instructions  Continue with levetiracetam  500 mg daily Start oxcarbazepine  300 mg twice daily Please contact me if you have another seizure Follow-up in 6 months or sooner if worse.   Per [place]  DMV statutes, patients with seizures are not allowed to drive until they have been seizure-free for six months.  Other recommendations include using caution when using heavy equipment or power tools. Avoid working on ladders or at heights. Take showers instead of baths.  Do not swim alone.  Ensure the water temperature is not too high on the home water  heater. Do not go swimming alone. Do not lock yourself in a room alone (i.e. bathroom). When caring for infants or small children, sit down when holding, feeding, or changing them to minimize  risk of injury to the child in the event you have a seizure. Maintain good sleep hygiene. Avoid alcohol.  Also recommend adequate sleep, hydration, good diet and minimize stress.   During the Seizure  - First, ensure adequate ventilation and place patients on the floor on their left side  Loosen clothing around the neck and ensure the airway is patent. If the patient is clenching the teeth, do not force the mouth open with any object as this can cause severe damage - Remove all items from the surrounding that can be hazardous. The patient may be oblivious to what's happening and may not even know what he or she is doing. If the patient is confused and wandering, either gently guide him/her away and block access to outside areas - Reassure the individual and be comforting - Call 911. In most cases, the seizure ends before EMS arrives. However, there are cases when seizures may last over 3 to 5 minutes. Or the individual may have developed breathing difficulties or severe injuries. If a pregnant patient or a person with diabetes develops a seizure, it is prudent to call an ambulance. - Finally, if the patient does not regain full consciousness, then call EMS. Most patients will remain confused for about 45 to 90 minutes after a seizure, so you must use judgment in calling for help. - Avoid restraints but make sure the patient is in a bed with padded side rails - Place the individual in a lateral position with the neck slightly flexed; this will help the saliva drain from the mouth and prevent the tongue from falling backward - Remove all nearby furniture and other hazards from the area - Provide verbal assurance as the individual is regaining consciousness - Provide the patient with privacy if possible - Call for  help and start treatment as ordered by the caregiver   After the Seizure (Postictal Stage)  After a seizure, most patients experience confusion, fatigue, muscle pain and/or a headache. Thus, one should permit the individual to sleep. For the next few days, reassurance is essential. Being calm and helping reorient the person is also of importance.  Most seizures are painless and end spontaneously. Seizures are not harmful to others but can lead to complications such as stress on the lungs, brain and the heart. Individuals with prior lung problems may develop labored breathing and respiratory distress.     No orders of the defined types were placed in this encounter.   Meds ordered this encounter  Medications   levETIRAcetam  (KEPPRA ) 500 MG tablet    Sig: Take 1 tablet (500 mg total) by mouth 2 (two) times daily.    Dispense:  180 tablet    Refill:  1   Oxcarbazepine  (TRILEPTAL ) 300 MG tablet    Sig: Take 1 tablet (300 mg total) by mouth 2 (two) times daily.    Dispense:  180 tablet    Refill:  3    Return in about 6 months (around 01/20/2025).  The patient's condition of epilepsy requires frequent monitoring and adjustments in the treatment plan, reflecting the ongoing complexity of care.  This provider is the continuing focal point for all needed services for this condition.   Pastor Falling, MD 07/23/2024, 11:37 AM  [facility] [address], [address] [place], [place] [address] [phone]

## 2025-02-25 ENCOUNTER — Ambulatory Visit: Admitting: Neurology
